# Patient Record
Sex: Male | Born: 1955 | Race: Black or African American | Hispanic: No | Marital: Single | Smoking: Never smoker
Health system: Southern US, Community
[De-identification: ages and names within clinical notes are randomized; demographics above are authoritative.]

## PROBLEM LIST (undated history)

## (undated) DIAGNOSIS — I219 Acute myocardial infarction, unspecified: Secondary | ICD-10-CM

## (undated) DIAGNOSIS — E785 Hyperlipidemia, unspecified: Secondary | ICD-10-CM

## (undated) DIAGNOSIS — M1990 Unspecified osteoarthritis, unspecified site: Secondary | ICD-10-CM

## (undated) DIAGNOSIS — I509 Heart failure, unspecified: Secondary | ICD-10-CM

## (undated) DIAGNOSIS — I10 Essential (primary) hypertension: Secondary | ICD-10-CM

## (undated) HISTORY — PX: HAND SURGERY: SHX662

## (undated) HISTORY — PX: NECK SURGERY: SHX720

---

## 1999-10-24 ENCOUNTER — Emergency Department (HOSPITAL_COMMUNITY): Admission: EM | Admit: 1999-10-24 | Discharge: 1999-10-24 | Payer: Self-pay | Admitting: Emergency Medicine

## 2002-05-29 ENCOUNTER — Emergency Department (HOSPITAL_COMMUNITY): Admission: EM | Admit: 2002-05-29 | Discharge: 2002-05-29 | Payer: Self-pay | Admitting: Emergency Medicine

## 2002-07-03 ENCOUNTER — Ambulatory Visit (HOSPITAL_BASED_OUTPATIENT_CLINIC_OR_DEPARTMENT_OTHER): Admission: RE | Admit: 2002-07-03 | Discharge: 2002-07-03 | Payer: Self-pay | Admitting: Surgery

## 2003-06-29 ENCOUNTER — Encounter: Payer: Self-pay | Admitting: Emergency Medicine

## 2003-06-29 ENCOUNTER — Inpatient Hospital Stay (HOSPITAL_COMMUNITY): Admission: EM | Admit: 2003-06-29 | Discharge: 2003-07-02 | Payer: Self-pay | Admitting: Emergency Medicine

## 2004-12-10 ENCOUNTER — Emergency Department (HOSPITAL_COMMUNITY): Admission: EM | Admit: 2004-12-10 | Discharge: 2004-12-10 | Payer: Self-pay | Admitting: Emergency Medicine

## 2007-03-21 ENCOUNTER — Ambulatory Visit: Payer: Self-pay | Admitting: Gastroenterology

## 2007-04-27 ENCOUNTER — Ambulatory Visit: Payer: Self-pay | Admitting: Gastroenterology

## 2007-04-27 ENCOUNTER — Encounter: Payer: Self-pay | Admitting: Gastroenterology

## 2011-03-19 NOTE — Op Note (Signed)
NAME:  RAYWOOD, WAILES                        ACCOUNT NO.:  [id number]   MEDICAL RECORD NO.:  [id number]                   PATIENT TYPE:  OBV   LOCATION:  5030                                 FACILITY:  [facility]   PHYSICIAN:  Artist Pais. Mina Marble, M.D.           DATE OF BIRTH:  May 31, 1956   DATE OF PROCEDURE:  06/29/2003  DATE OF DISCHARGE:                                 OPERATIVE REPORT   PREOPERATIVE DIAGNOSIS:  Right hand, mid-palmar space infection with right  ring flexor sheath infection.   POSTOPERATIVE DIAGNOSIS:  Right hand, mid-palmar space infection with right  ring flexor sheath infection.   PROCEDURE:  Evacuation of mid-palmar space, right hand as well as flexor  sheath irrigation.   SURGEON:  Artist Pais. Mina Marble, M.D.   ASSISTANT:  None.   ANESTHESIA:  General.   TOURNIQUET TIME:  35 minutes.   COMPLICATIONS:  None.   DRAINS:  Catheter.   DESCRIPTION OF PROCEDURE:  The patient was taken to the operating room.  After the smooth induction of adequate general anesthesia, the right upper  extremity was prepped and draped in the usual sterile fashion. The arm was  elevated for 5 minutes, the tourniquet was then inflated to 250 mmHg.  At  this point in time, a Lorrene Reid type incision was made over the palmar aspect  of the right hand over the mid-palmar area, extending out to the ring  finger. This incision was taken down through skin and subcutaneous tissues.  Flaps were raised accordingly. Purulent material was encountered over the  flexor sheath in the mid-palmar space of the right hand. This was cultured  x2.  This was thoroughly irrigated with 1 L of normal saline, as well as  with antibiotic impregnanted solution.   A #5 pediatric feeding tube was then placed into the flexor sheath of the  ring finger and a second incision was made in the mid-lateral position of  the radial side of the distal interphalangeal joint, ring finger and the  sheath was entered  between the A5 and A4 pulleys.  Continuous irrigation was  then taken out through the flexor sheath using #5 pediatric feeding tube,  again with antibiotic impregnanted solution, as well as with normal saline.  The skin incisions were closed loosely with 4-0 nylon, with catheter left in  place for continuous irrigation.  The patient was then placed in a sterile  dressing with Xeroform, 4 x 4's and a compressive hand dressing.  The  patient tolerated the procedure well, went to the recovery room for  continuous irrigation of the right ring flexor sheath.                                                 Artist Pais Mina Marble, M.D.   MAW/MEDQ  D:  06/29/2003  T:  06/30/2003  Job:  [id number]

## 2011-03-19 NOTE — Discharge Summary (Signed)
   NAME:  Todd Lynn, Todd Lynn                        ACCOUNT NO.:  [id number]   MEDICAL RECORD NO.:  [id number]                   PATIENT TYPE:  INP   LOCATION:  5030                                 FACILITY:  [facility]   PHYSICIAN:  Artist Pais. Mina Marble, M.D.           DATE OF BIRTH:  03-02-1956   DATE OF ADMISSION:  06/29/2003  DATE OF DISCHARGE:  07/02/2003                                 DISCHARGE SUMMARY   PRINCIPAL DIAGNOSIS:  Right mid palmar space infection and right ring finger  infection.   PRINCIPAL PROCEDURE:  Evacuation mid palmar space infection, irrigation and  debridement flexor sheath right ring finger.   HISTORY:  Todd Lynn is a 55 year old right-hand-dominant male who presented  to the emergency department on June 29, 2003 with four to five day history  of pain and swelling in his right hand which had gotten worse in the past 48  hours prior to his presentation to the emergency room and presents with  fluctuance and pain and swelling of the ring finger of his dominant right  hand.  At the time of admission, he had no known allergies, he was taking no  medications.  No family or social history of significance.   Exam showed grossly infected hand with Kanavel sign positive.  He was  febrile.  He was taken to the operating room that day where he underwent  irrigation and debridement of the flexor sheath right ring finger as well as  mid palmar space infection.  He was admitted to the hand service where he  was put on IV Unasyn.  He continued on IV Unasyn for a period of 48 hours in  the hospital and was discharged after placement of PIC line for continuation  of his IV Unasyn to treat his flexor sheath infection which grew out  Methicillin resistant Staphylococcus aureus.  He was discharged from the  hospital on July 02, 2003 on IV antibiotics to follow up in my office in  24 hours.                                                 Artist Pais Mina Marble, M.D.    MAW/MEDQ  D:  08/07/2003  T:  08/07/2003  Job:  [id number]

## 2011-03-19 NOTE — Op Note (Signed)
   NAME:  Todd Lynn, Todd Lynn                        ACCOUNT NO.:  [id number]   MEDICAL RECORD NO.:  [id number]                   PATIENT TYPE:  AMB   LOCATION:  DSC                                  FACILITY:  [facility]   PHYSICIAN:  Tedford A. Magnus Ivan, M.D.           DATE OF BIRTH:  September 15, 1956   DATE OF PROCEDURE:  07/03/2002  DATE OF DISCHARGE:                                 OPERATIVE REPORT   PREOPERATIVE DIAGNOSIS:  Chronic anal fissure.   POSTOPERATIVE DIAGNOSIS:  Chronic anal fissure.   PROCEDURE:  Examination under anesthesia and lateral internal anal  sphincterotomy.   SURGEON:  Kainalu A. Magnus Ivan, M.D.   ANESTHESIA:  General endotracheal anesthesia and 0.25% Marcaine with  epinephrine.   ESTIMATED BLOOD LOSS:  Minimal.   DESCRIPTION OF PROCEDURE:  The patient was brought to the operating room and  placed in the supine position and electively intubated.  He was then placed  in a prone position.  His perineum was then prepped and draped in the usual  sterile fashion.  The Hill-Ferguson retractor was inserted through the anal  canal.  Circumferential examination of the anal canal revealed a normal anus  without any large hemorrhoidal complexes.  The patient did appear to have a  posterior anal fissure, which was cauterized.  Next a lateral incision was  made on the right perineum and the sphincter muscles were identified and  then split with the electrocautery partially.  Hemostasis was then achieved  with the cautery.  The wound was then closed with several interrupted 0  chromic sutures.  The patient tolerated the procedure well.  The perianal  area was then anesthetized with 0.25% Marcaine with epinephrine.  Gauze was  then placed.  The patient was then placed back in the supine position and  then extubated in the operating room and taken in stable condition to the  recovery room.                                               Marcellous A. Magnus Ivan, M.D.    DAB/MEDQ  D:   07/03/2002  T:  07/04/2002  Job:  [id number]

## 2011-03-19 NOTE — Consult Note (Signed)
   NAME:  Todd Lynn, Todd Lynn                        ACCOUNT NO.:  [id number]   MEDICAL RECORD NO.:  [id number]                   PATIENT TYPE:  EMS   LOCATION:  MAJO                                 FACILITY:  [facility]   PHYSICIAN:  Artist Pais. Mina Marble, M.D.           DATE OF BIRTH:  13-May-1956   DATE OF CONSULTATION:  06/29/2003  DATE OF DISCHARGE:                                   CONSULTATION   REASON FOR CONSULTATION:  Mr. Mcmanamon is a 55 year old right handed dominant  male who presents today with a 4-6 day history of pain and swelling in his  right hand, which has gotten worse over the past 24 hours. He presents today  with fluctuance and pain with motion of the ring finger on his dominant  right hand.   ALLERGIES:  No known drug allergies.   CURRENT MEDICATIONS:  None.   PAST MEDICAL/SURGICAL HISTORY:  None.   FAMILY HISTORY:  Noncontributory.   SOCIAL HISTORY:  Noncontributory.   PHYSICAL EXAMINATION:  GENERAL: A well developed, well nourished male.  Pleasant, alert, and oriented x3.  EXTREMITIES: Examination of his right hand, he has pain and swelling in the  palmar aspect of his hand, in the area of the mid palmar space. He has an  area of fluctuance approximately 1 x 1 cm. He has no streaking.  No  adenopathy. By history, he has had a temperature of up to 100.5 over the  past 24 hours. He also has positive Kanavel signs on his right ring finger  with pain over the flexor sheath, pain with passive flexion, and extension  of the digit.   LABORATORY DATA:  X-rays in the emergency department show no evidence of  acute fracture dislocation or any evidence of significant osseous  abnormalities. Also of note, he has some slight swelling dorsally on the  hand, fairly nonspecific.   ASSESSMENT:  A 55 year old male with a problem mid palmar space infection on  the right hand as well as a probable flexor sheath infection of the ring  finger on the same hand.   RECOMMENDATIONS:   At this point in time, he is to go to the operating room  for irrigation and debridement and placement of a continuous irrigation  catheter to the ring finger. Admission to the hospital for IV antibiotics.                                               Artist Pais Mina Marble, M.D.    MAW/MEDQ  D:  06/29/2003  T:  06/30/2003  Job:  [id number]

## 2012-01-01 ENCOUNTER — Encounter (HOSPITAL_BASED_OUTPATIENT_CLINIC_OR_DEPARTMENT_OTHER): Payer: Self-pay | Admitting: *Deleted

## 2012-01-01 ENCOUNTER — Emergency Department (HOSPITAL_BASED_OUTPATIENT_CLINIC_OR_DEPARTMENT_OTHER)
Admission: EM | Admit: 2012-01-01 | Discharge: 2012-01-01 | Disposition: A | Discharge status: Home Health | Payer: Managed Care, Other (non HMO) | Attending: Emergency Medicine | Admitting: Emergency Medicine

## 2012-01-01 DIAGNOSIS — R197 Diarrhea, unspecified: Secondary | ICD-10-CM

## 2012-01-01 DIAGNOSIS — R109 Unspecified abdominal pain: Secondary | ICD-10-CM | POA: Insufficient documentation

## 2012-01-01 LAB — DIFFERENTIAL
Basophils Absolute: 0 10*3/uL (ref 0.0–0.1)
Eosinophils Relative: 2 % (ref 0–5)
Lymphocytes Relative: 7 % — ABNORMAL LOW (ref 12–46)
Neutro Abs: 10.9 10*3/uL — ABNORMAL HIGH (ref 1.7–7.7)

## 2012-01-01 LAB — LIPASE, BLOOD: Lipase: 17 U/L (ref 11–59)

## 2012-01-01 LAB — COMPREHENSIVE METABOLIC PANEL
BUN: 28 mg/dL — ABNORMAL HIGH (ref 6–23)
CO2: 23 mEq/L (ref 19–32)
Chloride: 103 mEq/L (ref 96–112)
Creatinine, Ser: 1.1 mg/dL (ref 0.50–1.35)
GFR calc non Af Amer: 74 mL/min — ABNORMAL LOW (ref >90)
Glucose, Bld: 116 mg/dL — ABNORMAL HIGH (ref 70–99)
Total Bilirubin: 0.5 mg/dL (ref 0.3–1.2)

## 2012-01-01 LAB — OCCULT BLOOD X 1 CARD TO LAB, STOOL: Fecal Occult Bld: NEGATIVE

## 2012-01-01 LAB — URINALYSIS, ROUTINE W REFLEX MICROSCOPIC
Glucose, UA: NEGATIVE mg/dL
Leukocytes, UA: NEGATIVE
Specific Gravity, Urine: 1.027 (ref 1.005–1.030)

## 2012-01-01 LAB — CBC
MCV: 80.1 fL (ref 78.0–100.0)
Platelets: 236 10*3/uL (ref 150–400)
RDW: 14.6 % (ref 11.5–15.5)
WBC: 12.7 10*3/uL — ABNORMAL HIGH (ref 4.0–10.5)

## 2012-01-01 LAB — URINE MICROSCOPIC-ADD ON

## 2012-01-01 MED ORDER — SODIUM CHLORIDE 0.9 % IV BOLUS (SEPSIS)
1000.0000 mL | Freq: Once | INTRAVENOUS | Status: AC
  Administered 2012-01-01: 1000 mL via INTRAVENOUS

## 2012-01-01 MED ORDER — DICYCLOMINE HCL 20 MG PO TABS: 20.0000 mg | ORAL_TABLET | Freq: Two times a day (BID) | ORAL | Status: DC

## 2012-01-01 MED ORDER — DICYCLOMINE HCL 10 MG/ML IM SOLN
20.0000 mg | Freq: Once | INTRAMUSCULAR | Status: AC
  Administered 2012-01-01: 20 mg via INTRAMUSCULAR
  Filled 2012-01-01: qty 2

## 2012-01-01 NOTE — ED Provider Notes (Signed)
History     CSN: [id number]  Arrival date & time 01/01/12  2208   First MD Initiated Contact with Patient 01/01/12 2224      Chief Complaint  Patient presents with  . Diarrhea  . Abdominal Cramping    (Consider location/radiation/quality/duration/timing/severity/associated sxs/prior treatment) HPI Comments: Pt states that he has had diarrhea since he woke up this morning and had diarrhea throughout the day:pt states that he developed mid abdominal cramping that is intermittent:pt denies vomiting or fever:pt states that he was trying to change his diet yesterday and ate a lot of chicken, shrimp and grapes  Patient is a 56 y.o. male presenting with diarrhea and cramps. The history is provided by the patient. No language interpreter was used.  Diarrhea The primary symptoms include diarrhea. Primary symptoms do not include fever, nausea or vomiting. The illness began today.  The illness does not include constipation.  Abdominal Cramping The primary symptoms of the illness include diarrhea. The primary symptoms of the illness do not include fever, nausea or vomiting. The current episode started 6 to 12 hours ago. The onset of the illness was gradual.  Symptoms associated with the illness do not include constipation or frequency.    History reviewed. No pertinent past medical history.  History reviewed. No pertinent past surgical history.  History reviewed. No pertinent family history.  History  Substance Use Topics  . Smoking status: Never Smoker   . Smokeless tobacco: Not on file  . Alcohol Use:       Review of Systems  Constitutional: Negative for fever.  Gastrointestinal: Positive for diarrhea. Negative for nausea, vomiting and constipation.  Genitourinary: Negative for frequency.  All other systems reviewed and are negative.    Allergies  Review of patient's allergies indicates no known allergies.  Home Medications  No current outpatient prescriptions on  file.  BP 140/92  Pulse 78  Temp(Src) 98.3 F (36.8 C) (Oral)  Resp 20  Ht 5\' 8"  (1.727 m)  Wt 215 lb (97.523 kg)  BMI 32.69 kg/m2  SpO2 100%  Physical Exam  Nursing note and vitals reviewed. Constitutional: He is oriented to person, place, and time. He appears well-developed and well-nourished.  HENT:  Head: Normocephalic and atraumatic.  Eyes: EOM are normal.  Neck: Neck supple.  Cardiovascular: Normal rate and regular rhythm.   Pulmonary/Chest: Effort normal and breath sounds normal.  Abdominal: Soft. Bowel sounds are normal. There is no tenderness.  Musculoskeletal: Normal range of motion.  Neurological: He is alert and oriented to person, place, and time.  Skin: Skin is warm and dry.  Psychiatric: He has a normal mood and affect.    ED Course  Procedures (including critical care time)  Labs Reviewed  URINALYSIS, ROUTINE W REFLEX MICROSCOPIC - Abnormal; Notable for the following:    Hgb urine dipstick SMALL (*)    All other components within normal limits  COMPREHENSIVE METABOLIC PANEL - Abnormal; Notable for the following:    Glucose, Bld 116 (*)    BUN 28 (*)    GFR calc non Af Amer 74 (*)    GFR calc Af Amer 86 (*)    All other components within normal limits  CBC - Abnormal; Notable for the following:    WBC 12.7 (*)    All other components within normal limits  DIFFERENTIAL - Abnormal; Notable for the following:    Neutrophils Relative 86 (*)    Neutro Abs 10.9 (*)    Lymphocytes Relative 7 (*)  All other components within normal limits  LIPASE, BLOOD  OCCULT BLOOD X 1 CARD TO LAB, STOOL  URINE MICROSCOPIC-ADD ON   No results found.   1. Diarrhea       MDM  Pt is feeling a lot better after the bentyl:will send home with something:pt symptoms likely viral:no recent antibiotics:pt abdomen is benign        Teressa Lower, NP 01/01/12 2351

## 2012-01-01 NOTE — ED Notes (Signed)
Pt stated that he has just urinated prior to check in, unable to provide specimen at this time.

## 2012-01-01 NOTE — ED Notes (Signed)
Pt presents to ED today with diarrhea and mid abd pain that started this afternoon and has steadily gotten worse.

## 2012-01-01 NOTE — Discharge Instructions (Signed)
Diarrhea Infections caused by germs (bacterial) or a virus commonly cause diarrhea. Your caregiver has determined that with time, rest and fluids, the diarrhea should improve. In general, eat normally while drinking more water than usual. Although water may prevent dehydration, it does not contain salt and minerals (electrolytes). Broths, weak tea without caffeine and oral rehydration solutions (ORS) replace fluids and electrolytes. Small amounts of fluids should be taken frequently. Large amounts at one time may not be tolerated. Plain water may be harmful in infants and the elderly. Oral rehydrating solutions (ORS) are available at pharmacies and grocery stores. ORS replace water and important electrolytes in proper proportions. Sports drinks are not as effective as ORS and may be harmful due to sugars worsening diarrhea.  ORS is especially recommended for use in children with diarrhea. As a general guideline for children, replace any new fluid losses from diarrhea and/or vomiting with ORS as follows:   If your child weighs 22 pounds or under (10 kg or less), give 60-120 mL ( -  cup or 2 - 4 ounces) of ORS for each episode of diarrheal stool or vomiting episode.   If your child weighs more than 22 pounds (more than 10 kgs), give 120-240 mL ( - 1 cup or 4 - 8 ounces) of ORS for each diarrheal stool or episode of vomiting.   While correcting for dehydration, children should eat normally. However, foods high in sugar should be avoided because this may worsen diarrhea. Large amounts of carbonated soft drinks, juice, gelatin desserts and other highly sugared drinks should be avoided.   After correction of dehydration, other liquids that are appealing to the child may be added. Children should drink small amounts of fluids frequently and fluids should be increased as tolerated. Children should drink enough fluids to keep urine clear or pale yellow.   Adults should eat normally while drinking more fluids  than usual. Drink small amounts of fluids frequently and increase as tolerated. Drink enough fluids to keep urine clear or pale yellow. Broths, weak decaffeinated tea, lemon lime soft drinks (allowed to go flat) and ORS replace fluids and electrolytes.   Avoid:   Carbonated drinks.   Juice.   Extremely hot or cold fluids.   Caffeine drinks.   Fatty, greasy foods.   Alcohol.   Tobacco.   Too much intake of anything at one time.   Gelatin desserts.   Probiotics are active cultures of beneficial bacteria. They may lessen the amount and number of diarrheal stools in adults. Probiotics can be found in yogurt with active cultures and in supplements.   Wash hands well to avoid spreading bacteria and virus.   Anti-diarrheal medications are not recommended for infants and children.   Only take over-the-counter or prescription medicines for pain, discomfort or fever as directed by your caregiver. Do not give aspirin to children because it may cause Reye's Syndrome.   For adults, ask your caregiver if you should continue all prescribed and over-the-counter medicines.   If your caregiver has given you a follow-up appointment, it is very important to keep that appointment. Not keeping the appointment could result in a chronic or permanent injury, and disability. If there is any problem keeping the appointment, you must call back to this facility for assistance.  SEEK IMMEDIATE MEDICAL CARE IF:   You or your child is unable to keep fluids down or other symptoms or problems become worse in spite of treatment.   Vomiting or diarrhea develops and becomes persistent.     There is vomiting of blood or bile (green material).   There is blood in the stool or the stools are black and tarry.   There is no urine output in 6-8 hours or there is only a small amount of very dark urine.   Abdominal pain develops, increases or localizes.   You have a fever.   Your baby is older than 3 months with a  rectal temperature of 102 F (38.9 C) or higher.   Your baby is 68 months old or younger with a rectal temperature of 100.4 F (38 C) or higher.   You or your child develops excessive weakness, dizziness, fainting or extreme thirst.   You or your child develops a rash, stiff neck, severe headache or become irritable or sleepy and difficult to awaken.  MAKE SURE YOU:   Understand these instructions.   Will watch your condition.   Will get help right away if you are not doing well or get worse.  Document Released: 10/08/2002 Document Revised: 06/30/2011 Document Reviewed: 08/25/2009 [facility] Patient Information 2012 [place], [place].B.R.A.T. Diet Your doctor has recommended the B.R.A.T. diet for you or your child until the condition improves. This is often used to help control diarrhea and vomiting symptoms. If you or your child can tolerate clear liquids, you may have:  Bananas.   Rice.   Applesauce.   Toast (and other simple starches such as crackers, potatoes, noodles).  Be sure to avoid dairy products, meats, and fatty foods until symptoms are better. Fruit juices such as apple, grape, and prune juice can make diarrhea worse. Avoid these. Continue this diet for 2 days or as instructed by your caregiver. Document Released: 10/18/2005 Document Revised: 06/30/2011 Document Reviewed: 04/06/2007 [facility] Patient Information 2012 [place], [place].

## 2012-01-06 NOTE — ED Provider Notes (Signed)
Medical screening examination/treatment/procedure(s) were performed by non-physician practitioner and as supervising physician I was immediately available for consultation/collaboration.   Dayton Bailiff, MD 01/06/12 2049

## 2014-05-14 ENCOUNTER — Ambulatory Visit (INDEPENDENT_AMBULATORY_CARE_PROVIDER_SITE_OTHER): Payer: Managed Care, Other (non HMO)

## 2014-05-14 ENCOUNTER — Ambulatory Visit (INDEPENDENT_AMBULATORY_CARE_PROVIDER_SITE_OTHER): Payer: Managed Care, Other (non HMO) | Admitting: Family Medicine

## 2014-05-14 VITALS — BP 140/90 | HR 63 | Temp 98.3°F | Resp 16 | Ht 68.25 in | Wt 213.2 lb

## 2014-05-14 DIAGNOSIS — R0789 Other chest pain: Secondary | ICD-10-CM

## 2014-05-14 DIAGNOSIS — Z13220 Encounter for screening for lipoid disorders: Secondary | ICD-10-CM

## 2014-05-14 DIAGNOSIS — E663 Overweight: Secondary | ICD-10-CM | POA: Insufficient documentation

## 2014-05-14 LAB — CBC
HEMATOCRIT: 45.3 % (ref 39.0–52.0)
Hemoglobin: 15.6 g/dL (ref 13.0–17.0)
MCH: 28.1 pg (ref 26.0–34.0)
MCHC: 34.4 g/dL (ref 30.0–36.0)
MCV: 81.6 fL (ref 78.0–100.0)
PLATELETS: 251 10*3/uL (ref 150–400)
RBC: 5.55 MIL/uL (ref 4.22–5.81)
RDW: 14.7 % (ref 11.5–15.5)
WBC: 10.2 10*3/uL (ref 4.0–10.5)

## 2014-05-14 NOTE — Patient Instructions (Signed)
Start taking 81 mg of aspirin daily to help protect your heart.  If your symptoms return or are persistent please seek help.  If you have chest pain that lasts for several minutes or is severe please go to the nearest ER or call 911  I will be in touch with your labs Your blood pressure is borderline- keep an eye on this

## 2014-05-14 NOTE — Progress Notes (Signed)
[facility] [address], [place] [id number] [phone]  Date:  05/14/2014   Name:  Todd Lynn   DOB:  10-Nov-1955   MRN:  [id number]  PCP:  No primary provider on file.    Chief Complaint: Chest Pain   History of Present Illness:  Todd Lynn is a 58 y.o. very pleasant male patient who presents with the following:  He is here today with pain in his left chest- he noted it this morning while sitting in his car at a red light. Felt a sharp pain under the left breast, which impeded his taking a deep breath.  It happened just one time and lasted less than 10 seconds.  No associated sweating, nausea, arm or jaw pain.  He felt like this was probably a muscle cramp but wanted to be sure all is well Right now he feels fine. No CP or SOB currently He felt fine yesterday.    He does not have any known heart problems.  He has not done a stress test or had other cardiac eval that he can recall He is not aware of any family history of heart problems.  He does walk some for for exercise and can walk and climb stairs iwthout any CP or SOB.    He does chew tobacco but does not smoke.  He does not use any drugs.   There are no active problems to display for this patient.   History reviewed. No pertinent past medical history.  History reviewed. No pertinent past surgical history.  History  Substance Use Topics  . Smoking status: Never Smoker   . Smokeless tobacco: Not on file  . Alcohol Use:     History reviewed. No pertinent family history.  No Known Allergies  Medication list has been reviewed and updated.  Current Outpatient Prescriptions on File Prior to Visit  Medication Sig Dispense Refill  . dicyclomine (BENTYL) 20 MG tablet Take 1 tablet (20 mg total) by mouth 2 (two) times daily.  20 tablet  0   No current facility-administered medications on file prior to visit.    Review of Systems:  As per HPI- otherwise negative.   Physical  Examination: Filed Vitals:   05/14/14 1355  BP: 138/92  Pulse: 63  Temp: 98.3 F (36.8 C)  Resp: 16   Filed Vitals:   05/14/14 1355  Height: 5' 8.25" (1.734 m)  Weight: 213 lb 3.2 oz (96.707 kg)   Body mass index is 32.16 kg/(m^2). Ideal Body Weight: Weight in (lb) to have BMI = 25: 165.3  GEN: WDWN, NAD, Non-toxic, A & O x 3, muscular build and mild overweight, otherwise looks well HEENT: Atraumatic, Normocephalic. Neck supple. No masses, No LAD.  Bilateral TM wnl, oropharynx normal.  PEERL,EOMI.   Ears and Nose: No external deformity. CV: RRR, No M/G/R. No JVD. No thrill. No extra heart sounds. PULM: CTA B, no wheezes, crackles, rhonchi. No retractions. No resp. distress. No accessory muscle use. I am able to evoke a vague tenderness over the left ribs just under the left chest.  No rash or lesion ABD: S, NT, ND, +BS. No rebound. No HSM. EXTR: No c/c/e NEURO Normal gait.  PSYCH: Normally interactive. Conversant. Not depressed or anxious appearing.  Calm demeanor.   EKG: sinus bradycardia  [facility] reading (PRIMARY) by  Dr. Patsy Lageropland. CXR: negative CHEST 2 VIEW  COMPARISON: None.  FINDINGS: Midline trachea. Normal heart size and mediastinal contours.  Lambert ModySharp  costophrenic angles. No pneumothorax. Clear lungs.  IMPRESSION: Normal chest.  He last ate at breakfast about 8 hours ago  Assessment and Plan: Other chest pain - Plan: EKG 12-Lead, DG Chest 2 View, CBC, Comprehensive metabolic panel  Screening for hyperlipidemia - Plan: Lipid panel  Daylon had a very brief episode of CP this morning.  His sx are now resolved, and his exam and EKG are reassuring.  Offered further eval of his CP now, but he would prefer to see if it comes back which is reasonable.  Will check labs as above for risk stratification, and recommended a baby asa daily.   See patient instructions for more details.     Signed Abbe Amsterdam, MD

## 2014-05-14 NOTE — Progress Notes (Signed)
58 year old male is here with complaints of left breast chest pain x1 day.  Pt indicates pain is sharp and has exp little tightness.  He states pain felt like a muscle spasm.  Pt states no other sxs

## 2014-05-15 ENCOUNTER — Encounter: Payer: Self-pay | Admitting: Family Medicine

## 2014-05-15 LAB — LIPID PANEL
CHOLESTEROL: 190 mg/dL (ref 0–200)
HDL: 57 mg/dL (ref >39)
LDL Cholesterol: 116 mg/dL — ABNORMAL HIGH (ref 0–99)
Total CHOL/HDL Ratio: 3.3 Ratio
Triglycerides: 84 mg/dL (ref <150)
VLDL: 17 mg/dL (ref 0–40)

## 2014-05-15 LAB — COMPREHENSIVE METABOLIC PANEL
ALBUMIN: 4.4 g/dL (ref 3.5–5.2)
ALT: 19 U/L (ref 0–53)
AST: 14 U/L (ref 0–37)
Alkaline Phosphatase: 86 U/L (ref 39–117)
BUN: 22 mg/dL (ref 6–23)
CALCIUM: 9.5 mg/dL (ref 8.4–10.5)
CHLORIDE: 104 meq/L (ref 96–112)
CO2: 27 meq/L (ref 19–32)
Creat: 1 mg/dL (ref 0.50–1.35)
Glucose, Bld: 94 mg/dL (ref 70–99)
POTASSIUM: 5.1 meq/L (ref 3.5–5.3)
Sodium: 138 mEq/L (ref 135–145)
TOTAL PROTEIN: 7.4 g/dL (ref 6.0–8.3)
Total Bilirubin: 0.5 mg/dL (ref 0.2–1.2)

## 2015-05-01 ENCOUNTER — Ambulatory Visit (INDEPENDENT_AMBULATORY_CARE_PROVIDER_SITE_OTHER): Payer: Managed Care, Other (non HMO) | Admitting: Physician Assistant

## 2015-05-01 VITALS — BP 120/74 | HR 77 | Temp 99.4°F | Resp 18 | Ht 69.0 in | Wt 209.0 lb

## 2015-05-01 DIAGNOSIS — J029 Acute pharyngitis, unspecified: Secondary | ICD-10-CM

## 2015-05-01 DIAGNOSIS — J020 Streptococcal pharyngitis: Secondary | ICD-10-CM

## 2015-05-01 LAB — POCT RAPID STREP A (OFFICE): Rapid Strep A Screen: POSITIVE — AB

## 2015-05-01 MED ORDER — FIRST-MOUTHWASH BLM MT SUSP: OROMUCOSAL | Status: DC

## 2015-05-01 MED ORDER — PENICILLIN G BENZATHINE 1200000 UNIT/2ML IM SUSP
1.2000 10*6.[IU] | Freq: Once | INTRAMUSCULAR | Status: AC
  Administered 2015-05-01: 1.2 10*6.[IU] via INTRAMUSCULAR

## 2015-05-01 NOTE — Patient Instructions (Signed)
Please push fluids.  Tylenol and Motrin for fever and body aches.    

## 2015-05-01 NOTE — Progress Notes (Signed)
   Todd Lynn  MRN: [id number] DOB: June 07, 1956  Subjective:  Pt presents to clinic with sore throat for the last 2 days that seems to be getting worse.  He is having trouble swallowing mainly from the pain because she feels like his throat is swollen.  He is having no other cold symptoms.  He has tried no home treatment for his symptoms.  He works nite shift and he was unable to go to work last night due to his pain.  Patient Active Problem List   Diagnosis Date Noted  . Overweight 05/14/2014    No current outpatient prescriptions on file prior to visit.   No current facility-administered medications on file prior to visit.    No Known Allergies  Review of Systems  Constitutional: Positive for fever (low grade - 99.9 last nght) and chills.  HENT: Positive for sore throat. Negative for congestion and postnasal drip.   Respiratory: Negative for cough.   Gastrointestinal: Negative.    Objective:  BP 120/74 mmHg  Pulse 77  Temp(Src) 99.4 F (37.4 C) (Oral)  Resp 18  Ht 5\' 9"  (1.753 m)  Wt 209 lb (94.802 kg)  BMI 30.85 kg/m2  SpO2 98%  Physical Exam  Constitutional: He is oriented to person, place, and time and well-developed, well-nourished, and in no distress.     HENT:  Head: Normocephalic and atraumatic.  Right Ear: Hearing, tympanic membrane, external ear and ear canal normal.  Left Ear: Hearing, tympanic membrane, external ear and ear canal normal.  Nose: Nose normal.  Mouth/Throat: Uvula is midline and mucous membranes are normal. Oropharyngeal exudate, posterior oropharyngeal edema and posterior oropharyngeal erythema present.  Eyes: Conjunctivae are normal.  Neck: Normal range of motion.  Cardiovascular: Normal rate, regular rhythm and normal heart sounds.   Pulmonary/Chest: Effort normal and breath sounds normal. He has no wheezes.  Lymphadenopathy:       Head (right side): No tonsillar and no occipital adenopathy present.       Head (left side): No  tonsillar and no occipital adenopathy present.    He has cervical adenopathy.       Right cervical: Superficial cervical adenopathy present.       Left cervical: Superficial cervical adenopathy present.       Right: No supraclavicular adenopathy present.       Left: No supraclavicular adenopathy present.  Neurological: He is alert and oriented to person, place, and time. Gait normal.  Skin: Skin is warm and dry.  Psychiatric: Mood, memory, affect and judgment normal.   Results for orders placed or performed in visit on 05/01/15  POCT rapid strep A  Result Value Ref Range   Rapid Strep A Screen Positive (A) Negative    Assessment and Plan :  Sore throat - Plan: POCT rapid strep A  Streptococcal sore throat - Plan: penicillin g benzathine (BICILLIN LA) 1200000 UNIT/2ML injection 1.2 Million Units, DPH-Lido-AlHydr-MgHydr-Simeth (FIRST-MOUTHWASH BLM) SUSP  Symptomatic treatment d/w pt.  Out of work for the next 24h due to the contagious nature of his illness.  Benny LennertSarah Jentzen Minasyan PA-C  [facility] 05/01/2015 9:16 AM

## 2015-05-03 NOTE — Addendum Note (Signed)
Addended by: Carmelina DaneANDERSON, JEFFERY S on: 05/03/2015 03:19 PM   Modules accepted: Kipp BroodSmartSet

## 2015-07-07 IMAGING — CR DG CHEST 2V
2 series · 2 of 2 positions shown · non-contrast
Comparison: None.

CLINICAL DATA: Chest pain.  Cough and fever.

EXAM:
CHEST  2 VIEW

[PA]
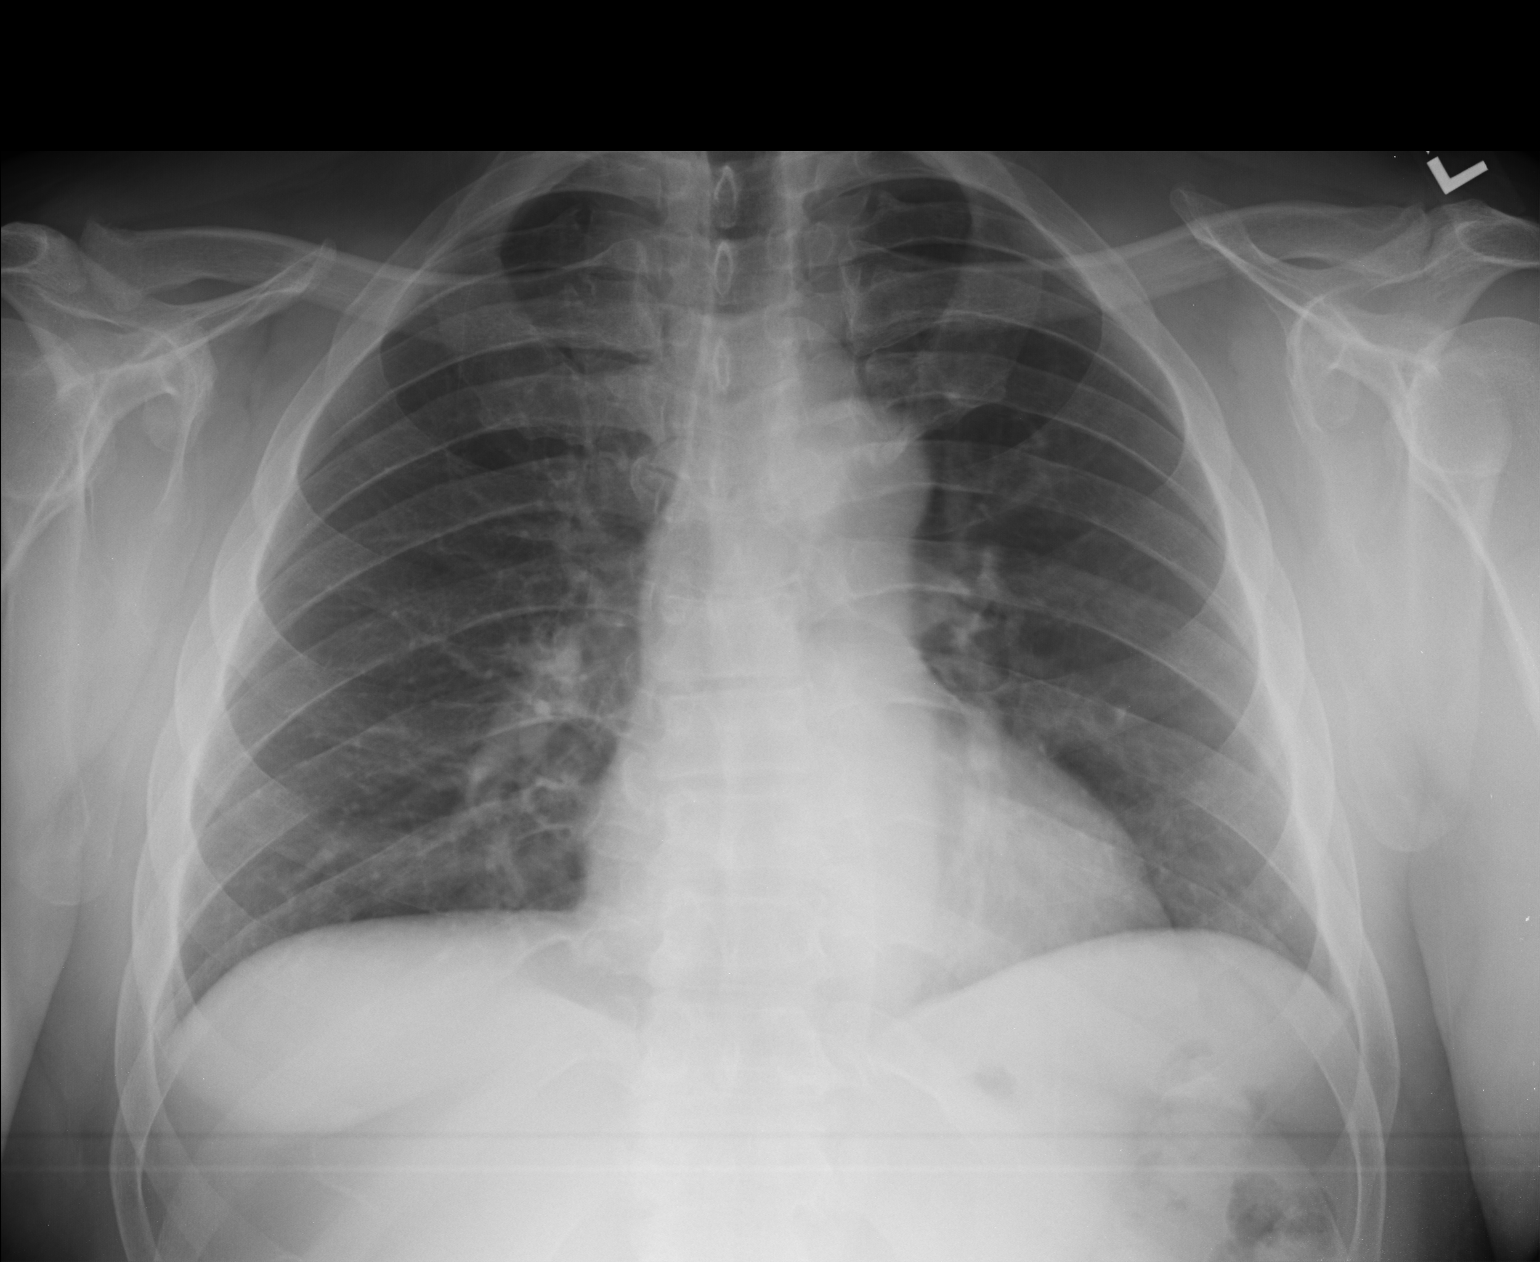

[lateral]
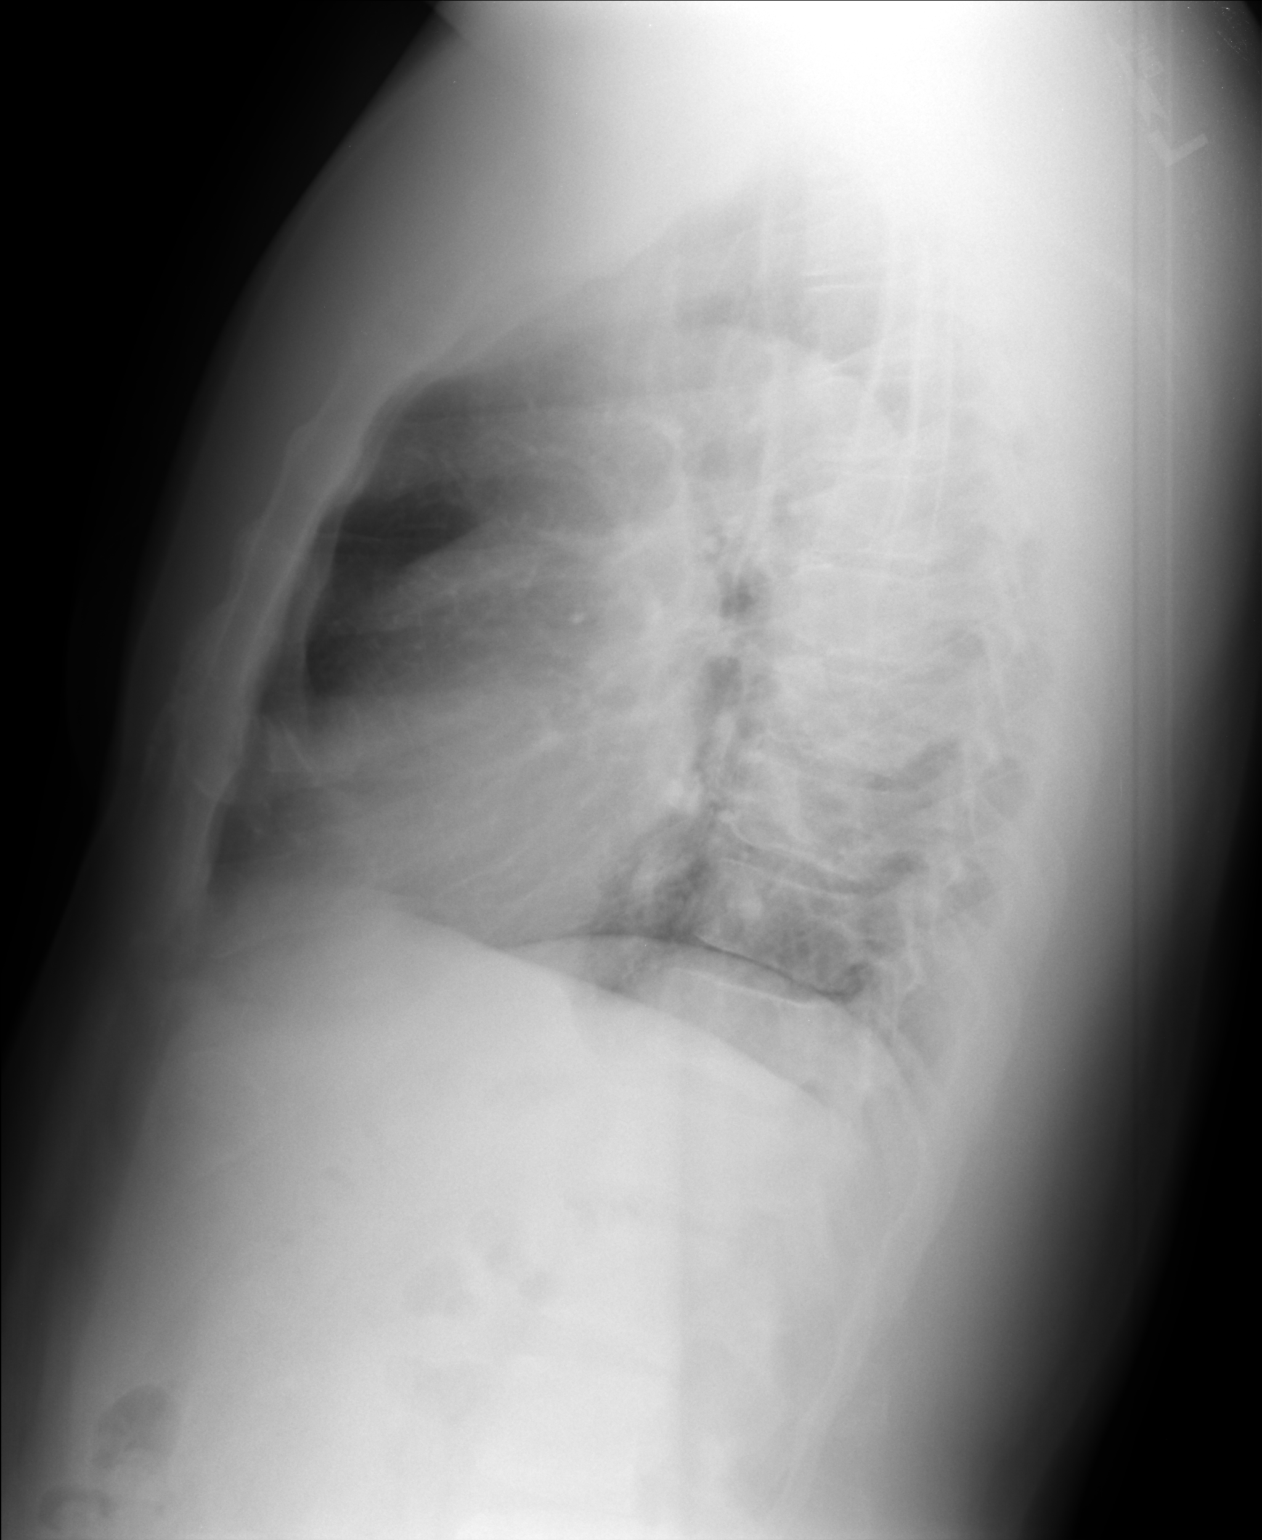

[2 of 2 positions shown; findings below may reference images not displayed]

FINDINGS: Midline trachea.  Normal heart size and mediastinal contours.

Sharp costophrenic angles.  No pneumothorax.  Clear lungs.
IMPRESSION: Normal chest.

## 2017-02-15 ENCOUNTER — Encounter: Payer: Self-pay | Admitting: Gastroenterology

## 2017-10-27 ENCOUNTER — Other Ambulatory Visit: Payer: Self-pay

## 2017-10-27 ENCOUNTER — Ambulatory Visit (HOSPITAL_COMMUNITY): Payer: 59

## 2017-10-27 ENCOUNTER — Ambulatory Visit (HOSPITAL_COMMUNITY)
Admission: EM | Admit: 2017-10-27 | Discharge: 2017-10-27 | Disposition: A | Discharge status: Home / Self Care | Payer: 59 | Attending: Emergency Medicine | Admitting: Emergency Medicine

## 2017-10-27 ENCOUNTER — Encounter (HOSPITAL_COMMUNITY): Payer: Self-pay | Admitting: Emergency Medicine

## 2017-10-27 DIAGNOSIS — R51 Headache: Secondary | ICD-10-CM | POA: Diagnosis not present

## 2017-10-27 DIAGNOSIS — S46919A Strain of unspecified muscle, fascia and tendon at shoulder and upper arm level, unspecified arm, initial encounter: Secondary | ICD-10-CM

## 2017-10-27 DIAGNOSIS — S53409A Unspecified sprain of unspecified elbow, initial encounter: Principal | ICD-10-CM

## 2017-10-27 DIAGNOSIS — S53401A Unspecified sprain of right elbow, initial encounter: Secondary | ICD-10-CM

## 2017-10-27 NOTE — ED Triage Notes (Signed)
Per pt right elbow pain, started yesterday after workout,

## 2017-10-27 NOTE — Discharge Instructions (Signed)
No workout with right upper extremity until cleared by orthopedist or physical therapist. Wear the arm sling off and on for the next 3-4 days. Remove periodically to move the shoulder around and for slow range of motion of the elbow. Follow-up with occupational health. Ice to the area of injury and swelling.

## 2017-10-27 NOTE — ED Provider Notes (Signed)
[facility]    CSN: [id number] Arrival date & time: 10/27/17  [id number]     History   Chief Complaint Chief Complaint  Patient presents with  . Elbow Pain    HPI Todd Lynn is a 61 y.o. male.   61 year old male who lifts weights and works out regularly states that he was bench pressing yesterday and noticed that about 30 minutes after his workout he developed pain to the right elbow. His primary workout involved a bench pressing. He has surrounding swelling and minor erythema. The swelling has much improved. But he does have continued limited range of motion. Denies any known injury as event. Denies trauma. Denies blunt trauma, popping feeling or sound. 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 This is the worst episode he has had before.      History reviewed. No pertinent past medical history.  Patient Active Problem List   Diagnosis Date Noted  . Overweight 05/14/2014    Past Surgical History:  Procedure Laterality Date  . HAND SURGERY Right   . NECK SURGERY         Home Medications    Prior to Admission medications   Medication Sig Start Date End Date Taking? Authorizing Provider  DPH-Lido-AlHydr-MgHydr-Simeth (FIRST-MOUTHWASH BLM) SUSP 5 ml every 1-2h prn sore throat - swish gargle spit. 05/01/15   Valarie ConesWeber, Dema SeverinSarah L, PA-C    Family History History reviewed. No pertinent family history.  Social History Social History   Tobacco Use  . Smoking status: Never Smoker  . Smokeless tobacco: Current User    Types: Chew  Substance Use Topics  . Alcohol use: Yes    Alcohol/week: 0.0 oz    Frequency: Never  . Drug use: No     Allergies   Patient has no known allergies.   Review of Systems Review of Systems  Constitutional: Negative.   Respiratory: Negative.   Gastrointestinal: Negative.   Genitourinary: Negative.   Musculoskeletal:       As per HPI  Skin: Negative.     Neurological: Positive for headaches. Negative for weakness and numbness.  All other systems reviewed and are negative.    Physical Exam Triage Vital Signs ED Triage Vitals [10/27/17 1022]  Enc Vitals Group     BP (!) 146/100     Pulse Rate 68     Resp      Temp 98 F (36.7 C)     Temp Source Oral     SpO2 99 %     Weight      Height      Head Circumference      Peak Flow      Pain Score 6     Pain Loc      Pain Edu?      Excl. in GC?    No data found.  Updated Vital Signs BP (!) 146/100 (BP Location: Left Arm)   Pulse 68   Temp 98 F (36.7 C) (Oral)   SpO2 99%   Visual Acuity Right Eye Distance:   Left Eye Distance:   Bilateral Distance:    Right Eye Near:   Left Eye Near:    Bilateral Near:     Physical Exam  Constitutional: He is oriented to person, place, and time. He appears well-developed and well-nourished.  HENT:  Head: Normocephalic and atraumatic.  Eyes: EOM are normal. Left eye  exhibits no discharge.  Neck: Normal range of motion. Neck supple.  Pulmonary/Chest: Effort normal.  Musculoskeletal: He exhibits tenderness. He exhibits no deformity.  Right elbow tenderness over the extensor tendons of the right forearm as it attaches to the olecranon. There is some swelling to this area. Pain is worse with extension of the wrist and digits. Patient does have some pain with limitation in flexion. No tenderness superior to the olecranon or involving the humerus. No biceps tenderness. Distal neurovascular motor Sentry is grossly intact. No bony tenderness.  Neurological: He is alert and oriented to person, place, and time. No cranial nerve deficit.  Skin: Skin is warm and dry.  Psychiatric: He has a normal mood and affect.  Nursing note and vitals reviewed.    UC Treatments / Results  Labs (all labs ordered are listed, but only abnormal results are displayed) Labs Reviewed - No data to display  EKG  EKG Interpretation None       Radiology No  results found.  Procedures Procedures (including critical care time)  Medications Ordered in UC Medications - No data to display   Initial Impression / Assessment and Plan / UC Course  I have reviewed the triage vital signs and the nursing notes.  Pertinent labs & imaging results that were available during my care of the patient were reviewed by me and considered in my medical decision making (see chart for details).    No workout with right upper extremity until cleared by orthopedist or physical therapist. Wear the arm sling off and on for the next 3-4 days. Remove periodically to move the shoulder around and for slow range of motion of the elbow. Follow-up with occupational health. Ice to the area of injury and swelling.    Final Clinical Impressions(s) / UC Diagnoses   Final diagnoses:  Sprain and strain of elbow    ED Discharge Orders    None       Controlled Substance Prescriptions [place] Controlled Substance Registry consulted? Not Applicable   Hayden RasmussenMabe, Larnce Schnackenberg, NP 10/27/17 1054

## 2017-10-27 NOTE — ED Notes (Signed)
Went to get patient for x-rays, provider was examining patient

## 2018-10-05 ENCOUNTER — Other Ambulatory Visit: Payer: Self-pay

## 2018-10-05 ENCOUNTER — Encounter (HOSPITAL_COMMUNITY): Payer: Self-pay

## 2018-10-05 ENCOUNTER — Ambulatory Visit (HOSPITAL_COMMUNITY)
Admission: EM | Admit: 2018-10-05 | Discharge: 2018-10-05 | Disposition: A | Discharge status: Home / Self Care | Payer: 59 | Attending: Nurse Practitioner | Admitting: Nurse Practitioner

## 2018-10-05 DIAGNOSIS — J206 Acute bronchitis due to rhinovirus: Secondary | ICD-10-CM

## 2018-10-05 MED ORDER — PREDNISONE 20 MG PO TABS: 40.0000 mg | ORAL_TABLET | Freq: Every day | ORAL | 0 refills | Status: AC

## 2018-10-05 MED ORDER — AZITHROMYCIN 250 MG PO TABS: 250.0000 mg | ORAL_TABLET | Freq: Every day | ORAL | 0 refills | Status: DC

## 2018-10-05 NOTE — Discharge Instructions (Addendum)
Take medications as prescribed.  You can continue taking the over-the-counter medications to control your symptoms as well.  Ibuprofen and/or Tylenol as needed for fevers or pain.  Drink plenty of fluids.  Follow-up with your primary care provider as needed.

## 2018-10-05 NOTE — ED Triage Notes (Signed)
Pt cc cough and cold. X 1 week or more.

## 2018-10-05 NOTE — ED Provider Notes (Signed)
[facility]    CSN: [id number] Arrival date & time: 10/05/18  1336     History   Chief Complaint Chief Complaint  Patient presents with  . Cough    HPI Todd Lynn is a 62 y.o. male.   Subjective:   Todd Lynn is a 62 y.o. male here for evaluation of a cough.  The cough is without wheezing, dyspnea or hemoptysis, productive of green/yellow sputum, waxing and waning over time and is aggravated by nothing. Onset of symptoms was a week and a half ago and has been waxing and waning since that time.  Associated symptoms include fever and rhinorrhea. Patient does not have a history of asthma. Patient has not had recent travel. Patient does not have a history of smoking.   The following portions of the patient's history were reviewed and updated as appropriate: allergies, current medications, past family history, past medical history, past social history, past surgical history and problem list.        History reviewed. No pertinent past medical history.  Patient Active Problem List   Diagnosis Date Noted  . Overweight 05/14/2014    Past Surgical History:  Procedure Laterality Date  . HAND SURGERY Right   . NECK SURGERY         Home Medications    Prior to Admission medications   Medication Sig Start Date End Date Taking? Authorizing Provider  azithromycin (ZITHROMAX) 250 MG tablet Take 1 tablet (250 mg total) by mouth daily. Take first 2 tablets together, then 1 every day until finished. 10/05/18   Lurline Idol, FNP  DPH-Lido-AlHydr-MgHydr-Simeth (FIRST-MOUTHWASH BLM) SUSP 5 ml every 1-2h prn sore throat - swish gargle spit. 05/01/15   Weber, Dema Severin, PA-C  predniSONE (DELTASONE) 20 MG tablet Take 2 tablets (40 mg total) by mouth daily for 5 days. 10/05/18 10/10/18  Lurline Idol, FNP    Family History History reviewed. No pertinent family history.  Social History Social History   Tobacco Use  . Smoking status: Never Smoker  .  Smokeless tobacco: Current User    Types: Chew  Substance Use Topics  . Alcohol use: Yes    Alcohol/week: 0.0 standard drinks    Frequency: Never  . Drug use: No     Allergies   Patient has no known allergies.   Review of Systems Review of Systems  Constitutional: Positive for fever.  HENT: Positive for congestion and rhinorrhea.   Eyes: Negative.   Respiratory: Positive for cough. Negative for shortness of breath and wheezing.   Cardiovascular: Negative.   Gastrointestinal: Negative.   Neurological: Negative.   All other systems reviewed and are negative.    Physical Exam Triage Vital Signs ED Triage Vitals  Enc Vitals Group     BP 10/05/18 1445 (!) 143/100     Pulse Rate 10/05/18 1445 72     Resp 10/05/18 1445 18     Temp 10/05/18 1445 98.4 F (36.9 C)     Temp Source 10/05/18 1445 Oral     SpO2 10/05/18 1445 100 %     Weight 10/05/18 1449 214 lb (97.1 kg)     Height --      Head Circumference --      Peak Flow --      Pain Score 10/05/18 1449 5     Pain Loc --      Pain Edu? --      Excl. in GC? --    No data found.  Updated Vital Signs BP (!) 143/100 (BP Location: Right Arm)   Pulse 72   Temp 98.4 F (36.9 C) (Oral)   Resp 18   Wt 214 lb (97.1 kg)   SpO2 100%   BMI 31.60 kg/m   Visual Acuity Right Eye Distance:   Left Eye Distance:   Bilateral Distance:    Right Eye Near:   Left Eye Near:    Bilateral Near:     Physical Exam  Constitutional: He is oriented to person, place, and time. He appears well-developed and well-nourished.  HENT:  Head: Normocephalic.  Right Ear: External ear normal.  Left Ear: External ear normal.  Nose: Nose normal.  Mouth/Throat: Oropharynx is clear and moist.  Eyes: Pupils are equal, round, and reactive to light. Conjunctivae and EOM are normal.  Neck: Normal range of motion. Neck supple.  Cardiovascular: Normal rate, regular rhythm and normal heart sounds.  Pulmonary/Chest: Effort normal and breath sounds  normal. No respiratory distress. He has no wheezes.  Musculoskeletal: Normal range of motion.  Lymphadenopathy:    He has no cervical adenopathy.  Neurological: He is alert and oriented to person, place, and time.  Skin: Skin is warm and dry.  Psychiatric: He has a normal mood and affect.     UC Treatments / Results  Labs (all labs ordered are listed, but only abnormal results are displayed) Labs Reviewed - No data to display  EKG None  Radiology No results found.  Procedures Procedures (including critical care time)  Medications Ordered in UC Medications - No data to display  Initial Impression / Assessment and Plan / UC Course  I have reviewed the triage vital signs and the nursing notes.  Pertinent labs & imaging results that were available during my care of the patient were reviewed by me and considered in my medical decision making (see chart for details).     62 year old male presenting with a week and a half history of persistent cough.  He has tried several different over-the-counter therapies without much relief in his symptoms.  Patient is afebrile.  Vital signs stable.  Nontoxic-appearing.  Physical exam unremarkable.  Conservative measures advised at this time.  Follow-up as needed.   Plan:  Antibiotics and steroids per medication orders. Continue over-the-counter therapies as needed for symptomatic management  Tylenol or ibuprofen as needed for fevers/pain  Follow-up with primary care provider as needed  Go to ED for shortness of breath, blood in sputum, change in character of cough, development of fever or chills, inability to maintain nutrition and hydration.  Avoid exposure to tobacco smoke and fumes.  Today's evaluation has revealed no signs of a dangerous process. Discussed diagnosis with patient. Patient aware of their diagnosis, possible red flag symptoms to watch out for and need for close follow up. Patient understands verbal and written discharge  instructions. Patient comfortable with plan and disposition.  Patient has a clear mental status at this time, good insight into illness (after discussion and teaching) and has clear judgment to make decisions regarding their care.  Documentation was completed with the aid of voice recognition software. Transcription may contain typographical errors. Final Clinical Impressions(s) / UC Diagnoses   Final diagnoses:  Acute bronchitis due to Rhinovirus     Discharge Instructions     Take medications as prescribed.  You can continue taking the over-the-counter medications to control your symptoms as well.  Ibuprofen and/or Tylenol as needed for fevers or pain.  Drink plenty of fluids.  Follow-up  with your primary care provider as needed.   ED Prescriptions    Medication Sig Dispense Auth. Provider   predniSONE (DELTASONE) 20 MG tablet Take 2 tablets (40 mg total) by mouth daily for 5 days. 10 tablet Lurline Idol, FNP   azithromycin (ZITHROMAX) 250 MG tablet Take 1 tablet (250 mg total) by mouth daily. Take first 2 tablets together, then 1 every day until finished. 6 tablet Lurline Idol, FNP     Controlled Substance Prescriptions [place] Controlled Substance Registry consulted? Not Applicable   Lurline Idol, [place] 10/05/18 1546

## 2020-03-19 ENCOUNTER — Other Ambulatory Visit: Payer: Self-pay

## 2020-03-19 ENCOUNTER — Ambulatory Visit (HOSPITAL_COMMUNITY)
Admission: EM | Admit: 2020-03-19 | Discharge: 2020-03-19 | Disposition: A | Discharge status: Home / Self Care | Payer: 59 | Attending: Emergency Medicine | Admitting: Emergency Medicine

## 2020-03-19 ENCOUNTER — Encounter (HOSPITAL_COMMUNITY): Payer: Self-pay

## 2020-03-19 DIAGNOSIS — Z20822 Contact with and (suspected) exposure to covid-19: Secondary | ICD-10-CM | POA: Insufficient documentation

## 2020-03-19 DIAGNOSIS — J020 Streptococcal pharyngitis: Secondary | ICD-10-CM | POA: Insufficient documentation

## 2020-03-19 LAB — POCT RAPID STREP A: Streptococcus, Group A Screen (Direct): POSITIVE — AB

## 2020-03-19 MED ORDER — AMOXICILLIN-POT CLAVULANATE 875-125 MG PO TABS: 1.0000 | ORAL_TABLET | Freq: Two times a day (BID) | ORAL | 0 refills | Status: AC

## 2020-03-19 MED ORDER — IBUPROFEN 800 MG PO TABS
ORAL_TABLET | ORAL | Status: AC
  Filled 2020-03-19: qty 1

## 2020-03-19 MED ORDER — ACETAMINOPHEN 325 MG PO TABS
975.0000 mg | ORAL_TABLET | Freq: Once | ORAL | Status: AC
  Administered 2020-03-19: 975 mg via ORAL

## 2020-03-19 MED ORDER — IBUPROFEN 600 MG PO TABS: 600.0000 mg | ORAL_TABLET | Freq: Four times a day (QID) | ORAL | 0 refills | Status: DC | PRN

## 2020-03-19 MED ORDER — DEXAMETHASONE SODIUM PHOSPHATE 10 MG/ML IJ SOLN
INTRAMUSCULAR | Status: AC
  Filled 2020-03-19: qty 1

## 2020-03-19 MED ORDER — DEXAMETHASONE SODIUM PHOSPHATE 10 MG/ML IJ SOLN
10.0000 mg | Freq: Once | INTRAMUSCULAR | Status: AC
  Administered 2020-03-19: 10 mg via INTRAMUSCULAR

## 2020-03-19 MED ORDER — ACETAMINOPHEN 325 MG PO TABS
ORAL_TABLET | ORAL | Status: AC
  Filled 2020-03-19: qty 3

## 2020-03-19 MED ORDER — IBUPROFEN 800 MG PO TABS
800.0000 mg | ORAL_TABLET | Freq: Once | ORAL | Status: AC
  Administered 2020-03-19: 800 mg via ORAL

## 2020-03-19 NOTE — ED Provider Notes (Signed)
HPI  SUBJECTIVE:  Patient reports sore throat starting 5 days ago. Sx worse with swallowing.  Sx better with nothing. Has been taking 200 mg of ibuprofen twice a day w/ o relief.  + Fever tmax 100   No neck stiffness  No Cough No nasal congestion, rhinorrhea No Myalgias No Headache No Rash  No loss of taste or smell No shortness of breath or difficulty breathing No nausea, vomiting No diarrhea No abdominal pain     No Recent Strep, mono, COVID exposure No reflux sxs No Allergy sxs  No Breathing difficulty, voice changes,  + sensation of throat swelling shut when he swallows No Drooling No Trismus No abx in past month.  Has gotten Pfizer Covid vaccine #2 in April No antipyretic in past 4-6 hrs  Past medical history of strep pharyngitis.  No history of diabetes, hypertension, mono, coronary disease, chronic kidney disease, HIV, immunocompromise, cancer.   PMD: [facility].   History reviewed. No pertinent past medical history.  Past Surgical History:  Procedure Laterality Date  . HAND SURGERY Right   . NECK SURGERY      History reviewed. No pertinent family history.  Social History   Tobacco Use  . Smoking status: Never Smoker  . Smokeless tobacco: Current User    Types: Chew  Substance Use Topics  . Alcohol use: Yes    Alcohol/week: 0.0 standard drinks  . Drug use: No    No current facility-administered medications for this encounter.  Current Outpatient Medications:  .  amoxicillin-clavulanate (AUGMENTIN) 875-125 MG tablet, Take 1 tablet by mouth 2 (two) times daily for 10 days., Disp: 20 tablet, Rfl: 0 .  ibuprofen (ADVIL) 600 MG tablet, Take 1 tablet (600 mg total) by mouth every 6 (six) hours as needed., Disp: 30 tablet, Rfl: 0  No Known Allergies   ROS  As noted in HPI.   Physical Exam  BP (!) 147/94 (BP Location: Right Arm)   Pulse 84   Temp (!) 101.3 F (38.5 C) (Oral)   Resp 16   SpO2 98%   Constitutional: Well developed,  well nourished, no acute distress Eyes:  EOMI, conjunctiva normal bilaterally HENT: Normocephalic, atraumatic,mucus membranes moist. - nasal congestion + intensely erythematous oropharynx + enlarged tonsils L>R + exudates. Uvula midline.  No trismus.  No muffled voice.  No drooling Respiratory: Normal inspiratory effort Cardiovascular: Normal rate, no murmurs, rubs, gallops GI: nondistended, nontender. No appreciable splenomegaly skin: No rash, skin intact Lymph: + Anterior cervical LN.  No posterior cervical lymphadenopathy.  No neck stiffness Musculoskeletal: no deformities Neurologic: Alert & oriented x 3, no focal neuro deficits Psychiatric: Speech and behavior appropriate.  ED Course   Medications  acetaminophen (TYLENOL) tablet 975 mg (975 mg Oral Given 03/19/20 1932)  ibuprofen (ADVIL) tablet 800 mg (800 mg Oral Given 03/19/20 1932)  dexamethasone (DECADRON) injection 10 mg (10 mg Intramuscular Given 03/19/20 1948)    Orders Placed This Encounter  Procedures  . SARS CORONAVIRUS 2 (TAT 6-24 HRS) Nasopharyngeal Nasopharyngeal Swab    Standing Status:   Standing    Number of Occurrences:   1    Order Specific Question:   Is this test for diagnosis or screening    Answer:   Screening    Order Specific Question:   Symptomatic for COVID-19 as defined by CDC    Answer:   No    Order Specific Question:   Hospitalized for COVID-19    Answer:   No  Order Specific Question:   Admitted to ICU for COVID-19    Answer:   No    Order Specific Question:   Previously tested for COVID-19    Answer:   No    Order Specific Question:   Resident in a congregate (group) care setting    Answer:   No    Order Specific Question:   Employed in healthcare setting    Answer:   No    Order Specific Question:   Has patient completed COVID vaccination(s) (2 doses of Pfizer/Moderna 1 dose of [employer])    Answer:   Yes  . POCT rapid strep A [facility])    Standing Status:   Standing     Number of Occurrences:   1    Results for orders placed or performed during the hospital encounter of 03/19/20 (from the past 24 hour(s))  POCT rapid strep A [facility])     Status: Abnormal   Collection Time: 03/19/20  7:41 PM  Result Value Ref Range   Streptococcus, Group A Screen (Direct) POSITIVE (A) NEGATIVE   No results found.  ED Clinical Impression  1. Strep pharyngitis      ED Assessment/Plan  Rapid strep positive.  Giving 800 mg IBU with 975 mg of Tylenol, 10 mg of dexamethasone IM x1 here. Home with Augmentin, ibuprofen, Tylenol, Benadryl/Maalox mixture. Patient to followup with PMD when necessary.  There is no evidence of peritonsillar abscess at this time, discussed with patient signs and symptoms of peritonsillar abscess.  He was given strict ER return precautions.  Discussed labs,  MDM, plan and followup with patient. Discussed sn/sx that should prompt return to the ED. patient agrees with plan.   Meds ordered this encounter  Medications  . acetaminophen (TYLENOL) tablet 975 mg  . ibuprofen (ADVIL) tablet 800 mg  . dexamethasone (DECADRON) injection 10 mg  . ibuprofen (ADVIL) 600 MG tablet    Sig: Take 1 tablet (600 mg total) by mouth every 6 (six) hours as needed.    Dispense:  30 tablet    Refill:  0  . amoxicillin-clavulanate (AUGMENTIN) 875-125 MG tablet    Sig: Take 1 tablet by mouth 2 (two) times daily for 10 days.    Dispense:  20 tablet    Refill:  0     *This clinic note was created using Scientist, clinical (histocompatibility and immunogenetics). Therefore, there may be occasional mistakes despite careful proofreading.     Domenick Gong, MD 03/19/20 2000

## 2020-03-19 NOTE — Discharge Instructions (Addendum)
Finish the Augmentin, even if you feel better.  1 gram of Tylenol and 600 mg ibuprofen together 3-4 times a day as needed for pain.  Make sure you drink plenty of extra fluids.  Some people find salt water gargles and  Traditional Medicinal's "Throat Coat" tea helpful. Take 5 mL of liquid Benadryl and 5 mL of Maalox. Mix it together, and then hold it in your mouth for as long as you can and then swallow. You may do this 4 times a day.  You should be feeling better in the next 24 hours.  Go to the ER if you get worse in any way.  Go to www.goodrx.com to look up your medications. This will give you a list of where you can find your prescriptions at the most affordable prices. Or ask the pharmacist what the cash price is, or if they have any other discount programs available to help make your medication more affordable. This can be less expensive than what you would pay with insurance.

## 2020-03-19 NOTE — ED Triage Notes (Signed)
C/o sore throat for a few days. Denies other symptoms.

## 2020-03-20 LAB — SARS CORONAVIRUS 2 (TAT 6-24 HRS): SARS Coronavirus 2: NEGATIVE

## 2020-08-25 ENCOUNTER — Other Ambulatory Visit: Payer: Self-pay

## 2020-08-25 ENCOUNTER — Encounter (HOSPITAL_COMMUNITY): Payer: Self-pay | Admitting: Family Medicine

## 2020-08-25 ENCOUNTER — Ambulatory Visit (HOSPITAL_COMMUNITY)
Admission: EM | Admit: 2020-08-25 | Discharge: 2020-08-25 | Disposition: A | Discharge status: Home / Self Care | Payer: Self-pay

## 2020-08-25 DIAGNOSIS — I10 Essential (primary) hypertension: Secondary | ICD-10-CM

## 2020-08-25 NOTE — Discharge Instructions (Addendum)
I would recommend seeing a primary care about your blood pressure.  Low sodium diet, exercise and loosing a few pounds can help.  Make sure that you are monitoring your blood pressures, and keep a log.  Follow up as needed for continued or worsening symptoms

## 2020-08-25 NOTE — ED Triage Notes (Signed)
Pt reports he went to his chiropractor today who refused to treat him because his BP was to high. Pt presents here for BP check . 158/98 in triage

## 2020-08-26 NOTE — ED Provider Notes (Signed)
[facility]    CSN: [id number] Arrival date & time: 08/25/20  1106      History   Chief Complaint Chief Complaint  Patient presents with  . Hypertension    HPI Todd Lynn is a 64 y.o. male.   Patient is a 64 year old male with no significant past medical history.  Todd Lynn presents today for elevated blood pressure reading.  Reporting Todd Lynn went to the chiropractor and they checked his blood pressure and recorded at 165/95.  They were unable to treat him due to his high blood pressure.  Denies being ever diagnosed with hypertension or being on medication.  Reading at 158/98 today.  Denies any concerning signs or symptoms to include dizziness, headache, blurred vision, chest pain or shortness of breath.     History reviewed. No pertinent past medical history.  Patient Active Problem List   Diagnosis Date Noted  . Overweight 05/14/2014    Past Surgical History:  Procedure Laterality Date  . HAND SURGERY Right   . NECK SURGERY         Home Medications    Prior to Admission medications   Medication Sig Start Date End Date Taking? Authorizing Provider  ibuprofen (ADVIL) 600 MG tablet Take 1 tablet (600 mg total) by mouth every 6 (six) hours as needed. 03/19/20  Yes Domenick Gong, MD    Family History History reviewed. No pertinent family history.  Social History Social History   Tobacco Use  . Smoking status: Never Smoker  . Smokeless tobacco: Current User    Types: Chew  Vaping Use  . Vaping Use: Never used  Substance Use Topics  . Alcohol use: Yes    Alcohol/week: 0.0 standard drinks  . Drug use: No     Allergies   Patient has no known allergies.   Review of Systems Review of Systems   Physical Exam Triage Vital Signs ED Triage Vitals  Enc Vitals Group     BP 08/25/20 1116 (!) 158/98     Pulse Rate 08/25/20 1116 71     Resp 08/25/20 1116 18     Temp --      Temp Source 08/25/20 1116 Oral     SpO2 08/25/20 1116 98 %      Weight 08/25/20 1117 215 lb (97.5 kg)     Height 08/25/20 1117 5\' 8"  (1.727 m)     Head Circumference --      Peak Flow --      Pain Score 08/25/20 1117 0     Pain Loc --      Pain Edu? --      Excl. in [place]? --    No data found.  Updated Vital Signs BP (!) 158/98 (BP Location: Right Arm)   Pulse 71   Resp 18   Ht 5\' 8"  (1.727 m)   Wt 215 lb (97.5 kg)   SpO2 98%   BMI 32.69 kg/m   Visual Acuity Right Eye Distance:   Left Eye Distance:   Bilateral Distance:    Right Eye Near:   Left Eye Near:    Bilateral Near:     Physical Exam Vitals and nursing note reviewed.  Constitutional:      Appearance: Normal appearance.  HENT:     Head: Normocephalic and atraumatic.     Nose: Nose normal.  Eyes:     Conjunctiva/sclera: Conjunctivae normal.  Cardiovascular:     Rate and Rhythm: Normal rate and regular rhythm.  Pulmonary:  Effort: Pulmonary effort is normal.     Breath sounds: Normal breath sounds.  Musculoskeletal:        General: Normal range of motion.     Cervical back: Normal range of motion.  Skin:    General: Skin is warm and dry.  Neurological:     General: No focal deficit present.     Mental Status: Todd Lynn is alert.  Psychiatric:        Mood and Affect: Mood normal.      UC Treatments / Results  Labs (all labs ordered are listed, but only abnormal results are displayed) Labs Reviewed - No data to display  EKG   Radiology No results found.  Procedures Procedures (including critical care time)  Medications Ordered in UC Medications - No data to display  Initial Impression / Assessment and Plan / UC Course  I have reviewed the triage vital signs and the nursing notes.  Pertinent labs & imaging results that were available during my care of the patient were reviewed by me and considered in my medical decision making (see chart for details).     Hypertension Patient had elevated blood pressure reading at the chiropractor today and elevated  blood pressure reading here today with value of 158/98. Rechecked by me with readings of 145/110 and 154/105 Patient plans to follow with PCP.  Todd Lynn is not having any concerning signs or symptoms. Recommended low-sodium diet, exercise and weight loss to improve blood pressure. Monitor blood pressures at home and keep a log Follow-up PCP in the next 4 to 6 weeks for recheck Final Clinical Impressions(s) / UC Diagnoses   Final diagnoses:  Hypertension, unspecified type     Discharge Instructions     I would recommend seeing a primary care about your blood pressure.  Low sodium diet, exercise and loosing a few pounds can help.  Make sure that you are monitoring your blood pressures, and keep a log.  Follow up as needed for continued or worsening symptoms     ED Prescriptions    None     PDMP not reviewed this encounter.   Janace Aris, NP 08/26/20 [phone]

## 2022-03-24 ENCOUNTER — Ambulatory Visit: Payer: Self-pay | Admitting: Family Medicine

## 2022-05-05 ENCOUNTER — Ambulatory Visit (INDEPENDENT_AMBULATORY_CARE_PROVIDER_SITE_OTHER): Payer: Medicare Other

## 2022-05-05 ENCOUNTER — Ambulatory Visit (HOSPITAL_COMMUNITY)
Admission: EM | Admit: 2022-05-05 | Discharge: 2022-05-05 | Disposition: A | Discharge status: Home / Self Care | Payer: Medicare Other | Attending: Family Medicine | Admitting: Family Medicine

## 2022-05-05 ENCOUNTER — Encounter (HOSPITAL_COMMUNITY): Payer: Self-pay | Admitting: Emergency Medicine

## 2022-05-05 DIAGNOSIS — M25552 Pain in left hip: Secondary | ICD-10-CM

## 2022-05-05 MED ORDER — KETOROLAC TROMETHAMINE 30 MG/ML IJ SOLN
INTRAMUSCULAR | Status: AC
  Filled 2022-05-05: qty 1

## 2022-05-05 MED ORDER — KETOROLAC TROMETHAMINE 30 MG/ML IJ SOLN
30.0000 mg | Freq: Once | INTRAMUSCULAR | Status: AC
  Administered 2022-05-05: 30 mg via INTRAMUSCULAR

## 2022-05-05 MED ORDER — METHYLPREDNISOLONE 4 MG PO TBPK: ORAL_TABLET | ORAL | 0 refills | Status: DC

## 2022-05-05 NOTE — ED Provider Notes (Signed)
[facility]    CSN: [id number] Arrival date & time: 05/05/22  6789      History   Chief Complaint Chief Complaint  Patient presents with   Hip Pain    HPI Todd Lynn is a 66 y.o. male.    Hip Pain   Here for left lateral hip pain.  This began yesterday and has worsened since it started.  No fall or trauma.  No rash, or fever, or urinary symptoms.  No neurologic symptoms  History reviewed. No pertinent past medical history.  Patient Active Problem List   Diagnosis Date Noted   Overweight 05/14/2014    Past Surgical History:  Procedure Laterality Date   HAND SURGERY Right    NECK SURGERY         Home Medications    Prior to Admission medications   Medication Sig Start Date End Date Taking? Authorizing Provider  methylPREDNISolone (MEDROL DOSEPAK) 4 MG TBPK tablet Take as per package instructions 05/05/22  Yes Shloimy Michalski, Janace Aris, MD    Family History History reviewed. No pertinent family history.  Social History Social History   Tobacco Use   Smoking status: Never   Smokeless tobacco: Current    Types: Chew  Vaping Use   Vaping Use: Never used  Substance Use Topics   Alcohol use: Yes    Alcohol/week: 0.0 standard drinks of alcohol   Drug use: No     Allergies   Patient has no known allergies.   Review of Systems Review of Systems   Physical Exam Triage Vital Signs ED Triage Vitals  Enc Vitals Group     BP 05/05/22 1026 (!) 154/99     Pulse Rate 05/05/22 1026 71     Resp 05/05/22 1026 14     Temp 05/05/22 1026 98 F (36.7 C)     Temp Source 05/05/22 1026 Oral     SpO2 05/05/22 1026 100 %     Weight --      Height --      Head Circumference --      Peak Flow --      Pain Score 05/05/22 1031 7     Pain Loc --      Pain Edu? --      Excl. in [place]? --    No data found.  Updated Vital Signs BP (!) 154/99 (BP Location: Left Arm)   Pulse 71   Temp 98 F (36.7 C) (Oral)   Resp 14   SpO2 100%   Visual  Acuity Right Eye Distance:   Left Eye Distance:   Bilateral Distance:    Right Eye Near:   Left Eye Near:    Bilateral Near:     Physical Exam Vitals reviewed.  Constitutional:      General: He is not in acute distress.    Appearance: He is not ill-appearing, toxic-appearing or diaphoretic.  Musculoskeletal:     Comments: There is some tenderness over his left greater trochanteric process and a little posterior to that.  No rashes seen.  No erythema or swelling there  Neurological:     Mental Status: He is alert and oriented to person, place, and time.  Psychiatric:        Behavior: Behavior normal.      UC Treatments / Results  Labs (all labs ordered are listed, but only abnormal results are displayed) Labs Reviewed - No data to display  EKG   Radiology DG Hip Unilat With  Pelvis 2-3 Views Left  Result Date: 05/05/2022 CLINICAL DATA:  Left hip pain.  No known injury EXAM: DG HIP (WITH OR WITHOUT PELVIS) 2-3V LEFT COMPARISON:  None Available. FINDINGS: There is no evidence of hip fracture or dislocation. There is no evidence of arthropathy or other focal bone abnormality. Spurring of the superior anteriorly iliac spine bilaterally left greater than right likely degenerative. IMPRESSION: Negative left hip Electronically Signed   By: Marlan Palau M.D.   On: 05/05/2022 11:08    Procedures Procedures (including critical care time)  Medications Ordered in UC Medications  ketorolac (TORADOL) 30 MG/ML injection 30 mg (has no administration in time range)    Initial Impression / Assessment and Plan / UC Course  I have reviewed the triage vital signs and the nursing notes.  Pertinent labs & imaging results that were available during my care of the patient were reviewed by me and considered in my medical decision making (see chart for details).     X-rays show a little bit of spurring over his anterior superior iliac spine.  No fractures or bone cyst.  I discussed with the  patient that this is most likely a bursitis or muscular strain.  We are going to treat with Toradol today and a steroid pack. Final Clinical Impressions(s) / UC Diagnoses   Final diagnoses:  Left hip pain     Discharge Instructions      You have been given a shot of Toradol 30 mg today.  Take the steroid pack according to the package instructions.  You can ice the area that hurts     ED Prescriptions     Medication Sig Dispense Auth. Provider   methylPREDNISolone (MEDROL DOSEPAK) 4 MG TBPK tablet Take as per package instructions 1 each Marlinda Mike, Janace Aris, MD      PDMP not reviewed this encounter.   Zenia Resides, MD 05/05/22 1122

## 2022-05-05 NOTE — Discharge Instructions (Addendum)
You have been given a shot of Toradol 30 mg today.  Take the steroid pack according to the package instructions.  You can ice the area that hurts

## 2022-05-05 NOTE — ED Triage Notes (Signed)
Patient c/o LFT hip pain that started yesterday.   Patient denies fall or trauma.   Patient endorses onset of symptoms began upon waking.   Patient endorses pain has progressively become worst.   Patient endorses worsening pain upon getting up from a chair or from laying position.   Patient hasn't taken any medications for symptoms.

## 2022-12-02 ENCOUNTER — Encounter (HOSPITAL_COMMUNITY): Payer: Self-pay | Admitting: *Deleted

## 2022-12-02 ENCOUNTER — Ambulatory Visit (HOSPITAL_COMMUNITY)
Admission: EM | Admit: 2022-12-02 | Discharge: 2022-12-02 | Disposition: A | Discharge status: Home / Self Care | Payer: Medicare Other | Attending: Emergency Medicine | Admitting: Emergency Medicine

## 2022-12-02 DIAGNOSIS — R0981 Nasal congestion: Secondary | ICD-10-CM

## 2022-12-02 DIAGNOSIS — J069 Acute upper respiratory infection, unspecified: Secondary | ICD-10-CM

## 2022-12-02 MED ORDER — METHYLPREDNISOLONE 4 MG PO TBPK: ORAL_TABLET | ORAL | 0 refills | Status: DC

## 2022-12-02 NOTE — ED Triage Notes (Signed)
Pt states he has sore throat, congestion, cough and a stiff neck X 3 days. He hasn't taken any meds.

## 2022-12-02 NOTE — Discharge Instructions (Addendum)
Patient will likely have something viral in nature Educated patient to take over-the-counter cough and cold medications and avoid any decongestants as this may elevate his blood pressure explained to patient that the symptoms may persist for 7 to 10 days Take Tylenol as needed for any pain or fever

## 2022-12-02 NOTE — ED Provider Notes (Signed)
[place]    CSN: [id number] Arrival date & time: 12/02/22  1008      History   Chief Complaint Chief Complaint  Patient presents with   Nasal Congestion   Cough   Sore Throat   Torticollis    HPI Todd Lynn is a 67 y.o. male.   Patient presents today with sore throat congestion cough stiff neck for 3 days.  Patient denies any fever no shortness of breath nonproductive cough.  States his daughter was ill with some viral a few days ago.  Patient has not taken anything prior to arrival denies chest pain.    History reviewed. No pertinent past medical history.  Patient Active Problem List   Diagnosis Date Noted   Overweight 05/14/2014    Past Surgical History:  Procedure Laterality Date   HAND SURGERY Right    NECK SURGERY         Home Medications    Prior to Admission medications   Medication Sig Start Date End Date Taking? Authorizing Provider  methylPREDNISolone (MEDROL DOSEPAK) 4 MG TBPK tablet Take as per package instructions 12/02/22   Marney Setting, NP    Family History History reviewed. No pertinent family history.  Social History Social History   Tobacco Use   Smoking status: Never   Smokeless tobacco: Current    Types: Chew  Vaping Use   Vaping Use: Never used  Substance Use Topics   Alcohol use: Yes    Alcohol/week: 0.0 standard drinks of alcohol   Drug use: No     Allergies   Patient has no known allergies.   Review of Systems Review of Systems  Constitutional:  Negative for chills and fever.  HENT:  Positive for congestion, postnasal drip, rhinorrhea, sinus pressure, sinus pain and sore throat.   Respiratory:  Positive for cough. Negative for shortness of breath and wheezing.   Cardiovascular: Negative.   Gastrointestinal: Negative.   Genitourinary: Negative.   Neurological: Negative.      Physical Exam Triage Vital Signs ED Triage Vitals  Enc Vitals Group     BP 12/02/22 1209 (!) 155/100      Pulse Rate 12/02/22 1209 63     Resp 12/02/22 1209 18     Temp 12/02/22 1209 98.2 F (36.8 C)     Temp Source 12/02/22 1209 Oral     SpO2 12/02/22 1209 97 %     Weight --      Height --      Head Circumference --      Peak Flow --      Pain Score 12/02/22 1208 0     Pain Loc --      Pain Edu? --      Excl. in [place]? --    No data found.  Updated Vital Signs BP (!) 155/100 (BP Location: Left Arm)   Pulse 63   Temp 98.2 F (36.8 C) (Oral)   Resp 18   SpO2 97%   Visual Acuity Right Eye Distance:   Left Eye Distance:   Bilateral Distance:    Right Eye Near:   Left Eye Near:    Bilateral Near:     Physical Exam Constitutional:      Appearance: He is well-developed.  HENT:     Right Ear: Tympanic membrane normal.     Left Ear: Tympanic membrane normal.     Nose: Congestion present.     Mouth/Throat:     Pharynx: Posterior  oropharyngeal erythema present. No pharyngeal swelling.     Tonsils: 1+ on the right. 1+ on the left.  Eyes:     Conjunctiva/sclera: Conjunctivae normal.  Cardiovascular:     Rate and Rhythm: Normal rate.  Pulmonary:     Effort: Pulmonary effort is normal.     Breath sounds: Normal breath sounds.  Abdominal:     Palpations: Abdomen is soft.  Musculoskeletal:     Cervical back: Normal range of motion.  Skin:    General: Skin is warm.     Capillary Refill: Capillary refill takes less than 2 seconds.  Neurological:     Mental Status: He is alert.      UC Treatments / Results  Labs (all labs ordered are listed, but only abnormal results are displayed) Labs Reviewed - No data to display  EKG   Radiology No results found.  Procedures Procedures (including critical care time)  Medications Ordered in UC Medications - No data to display  Initial Impression / Assessment and Plan / UC Course  I have reviewed the triage vital signs and the nursing notes.  Pertinent labs & imaging results that were available during my care of the patient  were reviewed by me and considered in my medical decision making (see chart for details).     Patient will likely have something viral in nature Educated patient to take over-the-counter cough and cold medications and avoid any decongestants as this may elevate his blood pressure explained to patient that the symptoms may persist for 7 to 10 days Take Tylenol as needed for any pain or fever Become worse Return for follow-up Final Clinical Impressions(s) / UC Diagnoses   Final diagnoses:  Viral URI with cough  Nasal congestion     Discharge Instructions      Patient will likely have something viral in nature Educated patient to take over-the-counter cough and cold medications and avoid any decongestants as this may elevate his blood pressure explained to patient that the symptoms may persist for 7 to 10 days Take Tylenol as needed for any pain or fever     ED Prescriptions     Medication Sig Dispense Auth. Provider   methylPREDNISolone (MEDROL DOSEPAK) 4 MG TBPK tablet Take as per package instructions 1 each Marney Setting, NP      PDMP not reviewed this encounter.   Marney Setting, NP 12/02/22 1257

## 2024-01-22 ENCOUNTER — Emergency Department (HOSPITAL_COMMUNITY)

## 2024-01-22 ENCOUNTER — Ambulatory Visit (HOSPITAL_COMMUNITY)
Admission: EM | Admit: 2024-01-22 | Discharge: 2024-01-22 | Disposition: A | Discharge status: Home / Self Care | Attending: Emergency Medicine | Admitting: Emergency Medicine

## 2024-01-22 ENCOUNTER — Inpatient Hospital Stay (HOSPITAL_COMMUNITY)
Admission: EM | Admit: 2024-01-22 | Discharge: 2024-01-23 | DRG: 321 | Disposition: A | Discharge status: Home / Self Care | Attending: Cardiovascular Disease | Admitting: Cardiovascular Disease

## 2024-01-22 ENCOUNTER — Other Ambulatory Visit: Payer: Self-pay

## 2024-01-22 ENCOUNTER — Encounter (HOSPITAL_COMMUNITY): Payer: Self-pay | Admitting: Emergency Medicine

## 2024-01-22 ENCOUNTER — Encounter (HOSPITAL_COMMUNITY)
Admission: EM | Disposition: A | Discharge status: Home / Self Care | Payer: Self-pay | Source: Home / Self Care | Attending: Cardiovascular Disease

## 2024-01-22 DIAGNOSIS — Z720 Tobacco use: Secondary | ICD-10-CM | POA: Diagnosis not present

## 2024-01-22 DIAGNOSIS — I110 Hypertensive heart disease with heart failure: Secondary | ICD-10-CM | POA: Diagnosis not present

## 2024-01-22 DIAGNOSIS — I2109 ST elevation (STEMI) myocardial infarction involving other coronary artery of anterior wall: Secondary | ICD-10-CM | POA: Diagnosis present

## 2024-01-22 DIAGNOSIS — I255 Ischemic cardiomyopathy: Secondary | ICD-10-CM | POA: Diagnosis not present

## 2024-01-22 DIAGNOSIS — I10 Essential (primary) hypertension: Secondary | ICD-10-CM | POA: Diagnosis present

## 2024-01-22 DIAGNOSIS — I2510 Atherosclerotic heart disease of native coronary artery without angina pectoris: Secondary | ICD-10-CM | POA: Diagnosis present

## 2024-01-22 DIAGNOSIS — R079 Chest pain, unspecified: Secondary | ICD-10-CM

## 2024-01-22 DIAGNOSIS — Z7902 Long term (current) use of antithrombotics/antiplatelets: Secondary | ICD-10-CM

## 2024-01-22 DIAGNOSIS — E782 Mixed hyperlipidemia: Secondary | ICD-10-CM | POA: Diagnosis not present

## 2024-01-22 DIAGNOSIS — E785 Hyperlipidemia, unspecified: Secondary | ICD-10-CM | POA: Insufficient documentation

## 2024-01-22 DIAGNOSIS — I213 ST elevation (STEMI) myocardial infarction of unspecified site: Principal | ICD-10-CM

## 2024-01-22 DIAGNOSIS — I2102 ST elevation (STEMI) myocardial infarction involving left anterior descending coronary artery: Secondary | ICD-10-CM | POA: Diagnosis not present

## 2024-01-22 DIAGNOSIS — I252 Old myocardial infarction: Secondary | ICD-10-CM

## 2024-01-22 DIAGNOSIS — Z79899 Other long term (current) drug therapy: Secondary | ICD-10-CM

## 2024-01-22 DIAGNOSIS — Z7982 Long term (current) use of aspirin: Secondary | ICD-10-CM

## 2024-01-22 DIAGNOSIS — Z955 Presence of coronary angioplasty implant and graft: Secondary | ICD-10-CM

## 2024-01-22 DIAGNOSIS — I5021 Acute systolic (congestive) heart failure: Secondary | ICD-10-CM | POA: Diagnosis present

## 2024-01-22 DIAGNOSIS — F17220 Nicotine dependence, chewing tobacco, uncomplicated: Secondary | ICD-10-CM | POA: Diagnosis present

## 2024-01-22 HISTORY — DX: Hyperlipidemia, unspecified: E78.5

## 2024-01-22 HISTORY — PX: CORONARY/GRAFT ACUTE MI REVASCULARIZATION: CATH118305

## 2024-01-22 HISTORY — PX: LEFT HEART CATH AND CORONARY ANGIOGRAPHY: CATH118249

## 2024-01-22 LAB — TROPONIN I (HIGH SENSITIVITY)
Troponin I (High Sensitivity): 379 ng/L (ref <18)
Troponin I (High Sensitivity): 464 ng/L (ref <18)
Troponin I (High Sensitivity): 398 ng/L (ref <18)

## 2024-01-22 LAB — LIPID PANEL
Cholesterol: 170 mg/dL (ref 0–200)
HDL: 45 mg/dL (ref >40)
LDL Cholesterol: 108 mg/dL — ABNORMAL HIGH (ref 0–99)
Total CHOL/HDL Ratio: 3.8 ratio
Triglycerides: 87 mg/dL (ref <150)
VLDL: 17 mg/dL (ref 0–40)

## 2024-01-22 LAB — BASIC METABOLIC PANEL
Anion gap: 7 (ref 5–15)
BUN: 17 mg/dL (ref 8–23)
CO2: 24 mmol/L (ref 22–32)
Calcium: 9.4 mg/dL (ref 8.9–10.3)
Chloride: 105 mmol/L (ref 98–111)
Creatinine, Ser: 0.92 mg/dL (ref 0.61–1.24)
GFR, Estimated: >60 mL/min (ref >60)
Glucose, Bld: 102 mg/dL — ABNORMAL HIGH (ref 70–99)
Potassium: 3.8 mmol/L (ref 3.5–5.1)
Sodium: 136 mmol/L (ref 135–145)

## 2024-01-22 LAB — POCT ACTIVATED CLOTTING TIME
Activated Clotting Time: 354 s
Activated Clotting Time: 360 s

## 2024-01-22 LAB — I-STAT CG4 LACTIC ACID, ED: Lactic Acid, Venous: 0.9 mmol/L (ref 0.5–1.9)

## 2024-01-22 LAB — CBC
HCT: 43.4 % (ref 39.0–52.0)
HCT: 44 % (ref 39.0–52.0)
Hemoglobin: 14.3 g/dL (ref 13.0–17.0)
Hemoglobin: 14.5 g/dL (ref 13.0–17.0)
MCH: 27.9 pg (ref 26.0–34.0)
MCH: 27.6 pg (ref 26.0–34.0)
MCHC: 32.9 g/dL (ref 30.0–36.0)
MCHC: 33 g/dL (ref 30.0–36.0)
MCV: 84.8 fL (ref 80.0–100.0)
MCV: 83.8 fL (ref 80.0–100.0)
Platelets: 292 10*3/uL (ref 150–400)
Platelets: 277 10*3/uL (ref 150–400)
RBC: 5.12 MIL/uL (ref 4.22–5.81)
RBC: 5.25 MIL/uL (ref 4.22–5.81)
RDW: 14.4 % (ref 11.5–15.5)
RDW: 14.4 % (ref 11.5–15.5)
WBC: 9.3 10*3/uL (ref 4.0–10.5)
WBC: 11.5 10*3/uL — ABNORMAL HIGH (ref 4.0–10.5)
nRBC: 0 % (ref 0.0–0.2)
nRBC: 0 % (ref 0.0–0.2)

## 2024-01-22 LAB — APTT: aPTT: 143 s — ABNORMAL HIGH (ref 24–36)

## 2024-01-22 LAB — PROTIME-INR
INR: 1.1 (ref 0.8–1.2)
Prothrombin Time: 14 s (ref 11.4–15.2)

## 2024-01-22 LAB — HEMOGLOBIN A1C
Hgb A1c MFr Bld: 6.2 % — ABNORMAL HIGH (ref 4.8–5.6)
Mean Plasma Glucose: 131.24 mg/dL

## 2024-01-22 LAB — CREATININE, SERUM
Creatinine, Ser: 0.91 mg/dL (ref 0.61–1.24)
GFR, Estimated: >60 mL/min (ref >60)

## 2024-01-22 SURGERY — CORONARY/GRAFT ACUTE MI REVASCULARIZATION
Anesthesia: LOCAL

## 2024-01-22 MED ORDER — HEPARIN (PORCINE) IN NACL 1000-0.9 UT/500ML-% IV SOLN
INTRAVENOUS | Status: DC | PRN
  Administered 2024-01-22 (×2): 500 mL

## 2024-01-22 MED ORDER — SODIUM CHLORIDE 0.9% FLUSH: 3.0000 mL | INTRAVENOUS | Status: DC | PRN

## 2024-01-22 MED ORDER — HEPARIN SODIUM (PORCINE) 1000 UNIT/ML IJ SOLN
INTRAMUSCULAR | Status: AC
  Filled 2024-01-22: qty 10

## 2024-01-22 MED ORDER — LIDOCAINE HCL (PF) 1 % IJ SOLN
INTRAMUSCULAR | Status: DC | PRN
  Administered 2024-01-22: 2 mL

## 2024-01-22 MED ORDER — SODIUM CHLORIDE 0.9 % IV SOLN: 250.0000 mL | INTRAVENOUS | Status: DC | PRN

## 2024-01-22 MED ORDER — ONDANSETRON HCL 4 MG/2ML IJ SOLN
4.0000 mg | Freq: Four times a day (QID) | INTRAMUSCULAR | Status: DC | PRN
Start: 2024-01-22 — End: 2024-01-23

## 2024-01-22 MED ORDER — ENOXAPARIN SODIUM 40 MG/0.4ML IJ SOSY
40.0000 mg | PREFILLED_SYRINGE | INTRAMUSCULAR | Status: DC
  Administered 2024-01-23: 40 mg via SUBCUTANEOUS
  Filled 2024-01-22: qty 0.4

## 2024-01-22 MED ORDER — NITROGLYCERIN 1 MG/10 ML FOR IR/CATH LAB
INTRA_ARTERIAL | Status: DC | PRN
  Administered 2024-01-22: 200 ug via INTRACORONARY

## 2024-01-22 MED ORDER — PNEUMOCOCCAL 20-VAL CONJ VACC 0.5 ML IM SUSY
0.5000 mL | PREFILLED_SYRINGE | INTRAMUSCULAR | Status: DC
  Filled 2024-01-22: qty 0.5

## 2024-01-22 MED ORDER — ORAL CARE MOUTH RINSE: 15.0000 mL | OROMUCOSAL | Status: DC | PRN

## 2024-01-22 MED ORDER — HEPARIN SODIUM (PORCINE) 1000 UNIT/ML IJ SOLN
INTRAMUSCULAR | Status: DC | PRN
  Administered 2024-01-22: 7000 [IU] via INTRAVENOUS

## 2024-01-22 MED ORDER — NITROGLYCERIN 1 MG/10 ML FOR IR/CATH LAB
INTRA_ARTERIAL | Status: AC
  Filled 2024-01-22: qty 10

## 2024-01-22 MED ORDER — VERAPAMIL HCL 2.5 MG/ML IV SOLN
INTRAVENOUS | Status: DC | PRN
  Administered 2024-01-22: 10 mL via INTRA_ARTERIAL

## 2024-01-22 MED ORDER — ASPIRIN 325 MG PO TABS: 325.0000 mg | ORAL_TABLET | Freq: Every day | ORAL | Status: DC

## 2024-01-22 MED ORDER — ASPIRIN 81 MG PO CHEW
81.0000 mg | CHEWABLE_TABLET | Freq: Every day | ORAL | Status: DC
  Administered 2024-01-23: 81 mg via ORAL
  Filled 2024-01-22: qty 1

## 2024-01-22 MED ORDER — PRASUGREL HCL 10 MG PO TABS
ORAL_TABLET | ORAL | Status: DC | PRN
  Administered 2024-01-22: 60 mg via ORAL

## 2024-01-22 MED ORDER — SODIUM CHLORIDE 0.9 % IV SOLN: INTRAVENOUS | Status: AC

## 2024-01-22 MED ORDER — LOSARTAN POTASSIUM 25 MG PO TABS
25.0000 mg | ORAL_TABLET | Freq: Every day | ORAL | Status: DC
  Administered 2024-01-22 – 2024-01-23 (×2): 25 mg via ORAL
  Filled 2024-01-22 (×2): qty 1

## 2024-01-22 MED ORDER — ASPIRIN 81 MG PO CHEW
324.0000 mg | CHEWABLE_TABLET | Freq: Once | ORAL | Status: AC
  Administered 2024-01-22: 324 mg via ORAL

## 2024-01-22 MED ORDER — IOHEXOL 350 MG/ML SOLN
INTRAVENOUS | Status: DC | PRN
  Administered 2024-01-22: 150 mL via INTRA_ARTERIAL

## 2024-01-22 MED ORDER — MIDAZOLAM HCL 2 MG/2ML IJ SOLN
INTRAMUSCULAR | Status: AC
  Filled 2024-01-22: qty 2

## 2024-01-22 MED ORDER — MIDAZOLAM HCL 2 MG/2ML IJ SOLN
INTRAMUSCULAR | Status: DC | PRN
  Administered 2024-01-22: 1 mg via INTRAVENOUS

## 2024-01-22 MED ORDER — VERAPAMIL HCL 2.5 MG/ML IV SOLN
INTRAVENOUS | Status: AC
  Filled 2024-01-22: qty 2

## 2024-01-22 MED ORDER — HEPARIN SODIUM (PORCINE) 5000 UNIT/ML IJ SOLN
4000.0000 [IU] | Freq: Once | INTRAMUSCULAR | Status: AC
  Administered 2024-01-22: 4000 [IU] via INTRAVENOUS

## 2024-01-22 MED ORDER — PRASUGREL HCL 10 MG PO TABS
10.0000 mg | ORAL_TABLET | Freq: Every day | ORAL | Status: DC
  Administered 2024-01-23: 10 mg via ORAL
  Filled 2024-01-22: qty 1

## 2024-01-22 MED ORDER — SODIUM CHLORIDE 0.9% FLUSH
3.0000 mL | Freq: Two times a day (BID) | INTRAVENOUS | Status: DC
  Administered 2024-01-22 – 2024-01-23 (×2): 3 mL via INTRAVENOUS

## 2024-01-22 MED ORDER — ATORVASTATIN CALCIUM 80 MG PO TABS
80.0000 mg | ORAL_TABLET | Freq: Every day | ORAL | Status: DC
  Administered 2024-01-22 – 2024-01-23 (×2): 80 mg via ORAL
  Filled 2024-01-22 (×2): qty 1

## 2024-01-22 MED ORDER — CARVEDILOL 3.125 MG PO TABS
3.1250 mg | ORAL_TABLET | Freq: Two times a day (BID) | ORAL | Status: DC
  Administered 2024-01-22 – 2024-01-23 (×2): 3.125 mg via ORAL
  Filled 2024-01-22 (×2): qty 1

## 2024-01-22 MED ORDER — LIDOCAINE HCL (PF) 1 % IJ SOLN
INTRAMUSCULAR | Status: AC
  Filled 2024-01-22: qty 30

## 2024-01-22 MED ORDER — SODIUM CHLORIDE 0.9 % IV SOLN
INTRAVENOUS | Status: AC | PRN
Start: 2024-01-22 — End: 2024-01-22
  Administered 2024-01-22: 10 mL/h via INTRAVENOUS

## 2024-01-22 MED ORDER — PRASUGREL HCL 10 MG PO TABS
ORAL_TABLET | ORAL | Status: AC
  Filled 2024-01-22: qty 1

## 2024-01-22 MED ORDER — ACETAMINOPHEN 325 MG PO TABS: 650.0000 mg | ORAL_TABLET | ORAL | Status: DC | PRN

## 2024-01-22 SURGICAL SUPPLY — 20 items
BALL SAPPHIRE NC24 3.0X15 (BALLOONS) ×1 IMPLANT
BALLN EMERGE MR 2.5X12 (BALLOONS) ×1 IMPLANT
BALLN EMERGE MR 3.5X12 (BALLOONS) IMPLANT
BALLOON EMERGE MR 2.5X12 (BALLOONS) IMPLANT
BALLOON EMERGE MR 3.5X12 (BALLOONS) IMPLANT
BALLOON SAPPHIRE NC24 3.0X15 (BALLOONS) IMPLANT
CATH INFINITI AMBI 5FR JK (CATHETERS) IMPLANT
CATH LAUNCHER 6FR EBU3.5 (CATHETERS) IMPLANT
DEVICE RAD COMP TR BAND LRG (VASCULAR PRODUCTS) IMPLANT
GLIDESHEATH SLEND SS 6F .021 (SHEATH) IMPLANT
GUIDEWIRE INQWIRE 1.5J.035X260 (WIRE) IMPLANT
INQWIRE 1.5J .035X260CM (WIRE) ×1 IMPLANT
KIT ENCORE 26 ADVANTAGE (KITS) IMPLANT
KIT SYRINGE INJ CVI SPIKEX1 (MISCELLANEOUS) IMPLANT
PACK CARDIAC CATHETERIZATION (CUSTOM PROCEDURE TRAY) ×1 IMPLANT
SET ATX-X65L (MISCELLANEOUS) IMPLANT
STENT SYNERGY XD 2.75X20 (Permanent Stent) IMPLANT
SYNERGY XD 2.75X20 (Permanent Stent) ×1 IMPLANT
TUBING CIL FLEX 10 FLL-RA (TUBING) IMPLANT
WIRE RUNTHROUGH .014X180CM (WIRE) IMPLANT

## 2024-01-22 NOTE — ED Provider Notes (Signed)
 [facility]    CSN: [id number] Arrival date & time: 01/22/24  1205      History   Chief Complaint Chief Complaint  Patient presents with   Chest Pain    HPI Todd Lynn is a 68 y.o. male.  Chest pressure started yesterday after 3 mile walk Located left side. Still feeling today but not as bad. Rates 3/10 currently.  Feels it increases with deep breath. Not feeling short of breath.  Denies cardiac history   Thinks it's a muscle spasm from working out, but wanted to make sure  Past Medical History:  Diagnosis Date   Hyperlipidemia     Patient Active Problem List   Diagnosis Date Noted   Overweight 05/14/2014    Past Surgical History:  Procedure Laterality Date   HAND SURGERY Right    NECK SURGERY         Home Medications    Prior to Admission medications   Medication Sig Start Date End Date Taking? Authorizing Provider  methylPREDNISolone (MEDROL DOSEPAK) 4 MG TBPK tablet Take as per package instructions 12/02/22   Coralyn Mark, NP    Family History No family history on file.  Social History Social History   Tobacco Use   Smoking status: Never   Smokeless tobacco: Current    Types: Chew  Vaping Use   Vaping status: Never Used  Substance Use Topics   Alcohol use: Yes    Alcohol/week: 0.0 standard drinks of alcohol   Drug use: No     Allergies   Patient has no known allergies.   Review of Systems Review of Systems  Cardiovascular:  Positive for chest pain.     Physical Exam Triage Vital Signs ED Triage Vitals [01/22/24 1236]  Encounter Vitals Group     BP (!) 127/90     Systolic BP Percentile      Diastolic BP Percentile      Pulse Rate 76     Resp 16     Temp 98.3 F (36.8 C)     Temp Source Oral     SpO2 100 %     Weight      Height      Head Circumference      Peak Flow      Pain Score 3     Pain Loc      Pain Education      Exclude from Growth Chart    No data found.  Updated Vital Signs BP  (!) 127/90 (BP Location: Right Arm)   Pulse 76   Temp 98.3 F (36.8 C) (Oral)   Resp 16   SpO2 100%    Physical Exam Vitals and nursing note reviewed.  Constitutional:      General: He is not in acute distress.    Appearance: Normal appearance. He is not ill-appearing, toxic-appearing or diaphoretic.  HENT:     Mouth/Throat:     Mouth: Mucous membranes are moist.     Pharynx: Oropharynx is clear.  Eyes:     Conjunctiva/sclera: Conjunctivae normal.  Cardiovascular:     Rate and Rhythm: Normal rate and regular rhythm.     Pulses: Normal pulses.          Radial pulses are 2+ on the right side and 2+ on the left side.     Heart sounds: Normal heart sounds.  Pulmonary:     Effort: Pulmonary effort is normal.     Breath sounds: Normal breath  sounds.  Abdominal:     Palpations: Abdomen is soft.  Musculoskeletal:     Cervical back: Normal range of motion.  Skin:    General: Skin is warm and dry.  Neurological:     Mental Status: He is alert and oriented to person, place, and time.      UC Treatments / Results  Labs (all labs ordered are listed, but only abnormal results are displayed) Labs Reviewed - No data to display  EKG   Radiology DG Chest Portable 1 View Result Date: 01/22/2024 CLINICAL DATA:  Chest pain EXAM: PORTABLE CHEST 1 VIEW COMPARISON:  05/14/2014 FINDINGS: AP portable radiograph. Numerous leads and wires project over the chest. Midline trachea. Normal heart size. No pleural effusion or pneumothorax. Clear lungs. IMPRESSION: No active disease. Electronically Signed   By: Jeronimo Greaves M.D.   On: 01/22/2024 14:23    Procedures Procedures (including critical care time)  Medications Ordered in UC Medications - No data to display  Initial Impression / Assessment and Plan / UC Course  I have reviewed the triage vital signs and the nursing notes.  Pertinent labs & imaging results that were available during my care of the patient were reviewed by me and  considered in my medical decision making (see chart for details).  Stable vitals  He is overall well appearing  EKG normal sinus with ventricular rate 67 bpm.  There is ST elevation in lead III however no reciprocal changes are noted.  Only prior comparison is from 10 years ago.  68 year old male with new onset chest pressure after exertion. I have advised evaluation in the emergency department, needs troponins to rule out cardiac abnormality. He is agreeable to ED eval, sent via POV as hospital is across parking lot from this clinic.   Final Clinical Impressions(s) / UC Diagnoses   Final diagnoses:  Chest pain, unspecified type   Discharge Instructions   None    ED Prescriptions   None    PDMP not reviewed this encounter.   Marlow Baars, [place] 01/22/24 1444

## 2024-01-22 NOTE — ED Notes (Signed)
 Patient is being discharged from the Urgent Care and sent to the Emergency Department via pov . 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 Patient is aware and verbalizes understanding of plan of care.  Vitals:   01/22/24 1236  BP: (!) 127/90  Pulse: 76  Resp: 16  Temp: 98.3 F (36.8 C)  SpO2: 100%

## 2024-01-22 NOTE — ED Triage Notes (Signed)
 Pt sent from UC for troponin check after pt reporting chest tightness since yesterday after walking. Pt endorses some mild pain under left pec.  Pt does go to gym and wants to make sure it isn't just a pulled muscle.

## 2024-01-22 NOTE — ED Provider Notes (Signed)
 Emergency Department Provider Note   I have reviewed the triage vital signs and the nursing notes.   HISTORY  Chief Complaint Chest Pain   HPI Todd Lynn is a 68 y.o. male past history of hyperlipidemia presents to the emergency department from urgent care with chest pressure and an abnormal EKG.  Patient states he was on a walk yesterday and developed some central chest heaviness/tightness.  Some pain radiating into the left upper chest.  Initially thought it could have been a pulled muscle but symptoms persisted into the morning and so presents initially to urgent care.  They performed an EKG showing ST changes inferiorly and advised that he present immediately to the emergency department, which he did.  He is currently having some very mild heaviness in the center of the chest.  No diaphoresis or nausea.  No prior history of ACS.   Past Medical History:  Diagnosis Date   Hyperlipidemia     Review of Systems  Constitutional: No fever/chills Cardiovascular: Positive chest pain. Respiratory: Denies shortness of breath. Gastrointestinal: No abdominal pain.  No nausea, no vomiting.   Skin: Negative for rash. Neurological: Negative for headaches.  ____________________________________________   PHYSICAL EXAM:  VITAL SIGNS: ED Triage Vitals  Encounter Vitals Group     BP      Systolic BP Percentile      Diastolic BP Percentile      Pulse      Resp      Temp      Temp src      SpO2      Weight      Height      Head Circumference      Peak Flow      Pain Score      Pain Loc      Pain Education      Exclude from Growth Chart     Constitutional: Alert and oriented. Well appearing and in no acute distress. Eyes: Conjunctivae are normal.  Head: Atraumatic. Nose: No congestion/rhinnorhea. Mouth/Throat: Mucous membranes are moist.  Neck: No stridor.  Cardiovascular: Normal rate, regular rhythm. Good peripheral circulation. Grossly normal heart sounds.    Respiratory: Normal respiratory effort.  No retractions. Lungs CTAB. Gastrointestinal: Soft and nontender. No distention.  Musculoskeletal: No gross deformities of extremities. Neurologic:  Normal speech and language.  Skin:  Skin is warm, dry and intact. No rash noted.   ____________________________________________   LABS (all labs ordered are listed, but only abnormal results are displayed)  Labs Reviewed  BASIC METABOLIC PANEL  CBC  HEMOGLOBIN A1C  PROTIME-INR  APTT  LIPID PANEL  I-STAT CG4 LACTIC ACID, ED  TROPONIN I (HIGH SENSITIVITY)   ____________________________________________  EKG   EKG Interpretation Date/Time:    Ventricular Rate:    PR Interval:    QRS Duration:    QT Interval:    QTC Calculation:   R Axis:      Text Interpretation:          ____________________________________________  RADIOLOGY  No results found.  ____________________________________________   PROCEDURES  Procedure(s) performed:   Procedures  CRITICAL CARE Performed by: Maia Plan Total critical care time: 35 minutes Critical care time was exclusive of separately billable procedures and treating other patients. Critical care was necessary to treat or prevent imminent or life-threatening deterioration. Critical care was time spent personally by me on the following activities: development of treatment plan with patient and/or surrogate as well as nursing, discussions with  consultants, evaluation of patient's response to treatment, examination of patient, obtaining history from patient or surrogate, ordering and performing treatments and interventions, ordering and review of laboratory studies, ordering and review of radiographic studies, pulse oximetry and re-evaluation of patient's condition.  Alona Bene, MD Emergency Medicine  ____________________________________________   INITIAL IMPRESSION / ASSESSMENT AND PLAN / ED COURSE  Pertinent labs & imaging results  that were available during my care of the patient were reviewed by me and considered in my medical decision making (see chart for details).   This patient is Presenting for Evaluation of ***, which {Range:23949} require a range of treatment options, and {MDMcomplaint:23950} a complaint that involves a {MDMlevelrisk:23951} risk of morbidity and mortality.  The Differential Diagnoses include***.  Critical Interventions-    Medications  0.9 %  sodium chloride infusion (has no administration in time range)  aspirin chewable tablet 324 mg (has no administration in time range)  heparin injection 60 Units/kg (has no administration in time range)  aspirin tablet 325 mg (has no administration in time range)    Reassessment after intervention:     I *** Additional Historical Information from ***, as the patient is ***.  I decided to review pertinent External Data, and in summary ***.   Clinical Laboratory Tests Ordered, included   Radiologic Tests Ordered, included ***. I independently interpreted the images and agree with radiology interpretation.   Cardiac Monitor Tracing which shows ***   Social Determinants of Health Risk ***  Consult complete with Code STEMI activated from triage after my assessment. Spoke with Dr. Kirke Corin who is coming in for evaluation.   Medical Decision Making: Summary: ***  Reevaluation with update and discussion with   ***Considered admission***  Patient's presentation is most consistent with {EM COPA:27473}   Disposition:   ____________________________________________  FINAL CLINICAL IMPRESSION(S) / ED DIAGNOSES  Final diagnoses:  None     NEW OUTPATIENT MEDICATIONS STARTED DURING THIS VISIT:  New Prescriptions   No medications on file    Note:  This document was prepared using Dragon voice recognition software and may include unintentional dictation errors.  Alona Bene, MD, [facility] Emergency Medicine

## 2024-01-22 NOTE — ED Triage Notes (Signed)
 Pt reports left chest tightness since yesterday after walking. Denies any cardiac hx.

## 2024-01-22 NOTE — ED Provider Notes (Deleted)
 Chest pressure started yesterday after 3 mile walk Located left side. Still feeling today but not as bad. Rates 3/10 currently.  Feels it increases with deep breath. Not short of breath.  EKG normal sinus with ventricular rate 67 bpm.  There is ST elevation in lead III however no reciprocal changes are noted.  Only prior comparison is from 10 years ago.  68 year old male with new onset chest pressure after exertion. I have advised evaluation in the emergency department, needs troponins to rule out cardiac abnormality. He is agreeable to ED eval, sent via POV   Bular Hickok, Ray Church 01/22/24 1256

## 2024-01-22 NOTE — H&P (Signed)
 Cardiology Admission History and Physical   Patient ID: Todd Lynn MRN: [id number]; DOB: 10/27/1956   Admission date: 01/22/2024  PCP:  Camie Patience, FNP   [facility] Providers Cardiologist:  None   {    Chief Complaint: Chest tightness  Patient Profile:   Todd Lynn is a 68 y.o. male with no prior cardiac history who is being seen 01/22/2024 for the evaluation of chest pain and ST elevation myocardial infarction.  History of Present Illness:   Todd Lynn is a 68 year old male with past medical history of untreated hypertension and hyperlipidemia.  In addition, he chews tobacco.  He presented with intermittent chest pain and tightness that started yesterday with activities.  The pain was overall mild and he thought that he pulled a muscle.  He went to urgent care today and his EKG showed borderline ST changes in the inferior leads.  He was discharged and asked to come to the ED for evaluation.  His EKG showed 1 mm of ST elevation starting in V3-V6 as well as the inferior leads.  He was in no distress but was still having chest discomfort and thus a code STEMI was activated.  He was given aspirin and 4000 units of unfractionated heparin.   Past Medical History:  Diagnosis Date   Hyperlipidemia     Past Surgical History:  Procedure Laterality Date   HAND SURGERY Right    NECK SURGERY       Medications Prior to Admission: Prior to Admission medications   Medication Sig Start Date End Date Taking? Authorizing Provider  methylPREDNISolone (MEDROL DOSEPAK) 4 MG TBPK tablet Take as per package instructions 12/02/22   Coralyn Mark, NP     Allergies:   No Known Allergies  Social History:   Social History   Socioeconomic History   Marital status: Single    Spouse name: Not on file   Number of children: Not on file   Years of education: Not on file   Highest education level: Not on file  Occupational History   Not on file  Tobacco Use    Smoking status: Never   Smokeless tobacco: Current    Types: Chew  Vaping Use   Vaping status: Never Used  Substance and Sexual Activity   Alcohol use: Yes    Alcohol/week: 0.0 standard drinks of alcohol   Drug use: No   Sexual activity: Not Currently  Other Topics Concern   Not on file  Social History Narrative   Not on file   Social Drivers of Health   Financial Resource Strain: Not on file  Food Insecurity: Not on file  Transportation Needs: Not on file  Physical Activity: Not on file  Stress: Not on file  Social Connections: Not on file  Intimate Partner Violence: Not on file    Family History:   The patient's family history is not on file.    ROS:  Please see the history of present illness.  All other ROS reviewed and negative.     Physical Exam/Data:   Vitals:   01/22/24 1504 01/22/24 1509 01/22/24 1514 01/22/24 1525  BP: (!) 143/99 (!) 143/99  (!) 152/105  Pulse: 68 (!) 0 (!) 0 66  Resp: 19 19    Temp:      TempSrc:      SpO2: 98% 98%  95%  Weight:      Height:        Intake/Output Summary (Last 24 hours)  at 01/22/2024 1547 Last data filed at 01/22/2024 1530 Gross per 24 hour  Intake --  Output 750 ml  Net -750 ml      01/22/2024    1:00 PM 08/25/2020   11:17 AM 10/05/2018    2:49 PM  Last 3 Weights  Weight (lbs) 220 lb 7.4 oz 215 lb 214 lb  Weight (kg) 100 kg 97.523 kg 97.07 kg     Body mass index is 33.52 kg/m.  General:  Well nourished, well developed, in no acute distress HEENT: normal Neck: no JVD Vascular: No carotid bruits; Distal pulses 2+ bilaterally   Cardiac:  normal S1, S2; RRR; no murmur  Lungs:  clear to auscultation bilaterally, no wheezing, rhonchi or rales  Abd: soft, nontender, no hepatomegaly  Ext: no edema Musculoskeletal:  No deformities, BUE and BLE strength normal and equal Skin: warm and dry  Neuro:  CNs 2-12 intact, no focal abnormalities noted Psych:  Normal affect    EKG:  The ECG that was done  was personally  reviewed and demonstrates normal sinus rhythm with 1 mm of ST elevation starting in V3-V6 and inferior leads  Relevant CV Studies:   Laboratory Data:  High Sensitivity Troponin:   Recent Labs  Lab 01/22/24 1322  TROPONINIHS 379*      Chemistry Recent Labs  Lab 01/22/24 1322  NA 136  K 3.8  CL 105  CO2 24  GLUCOSE 102*  BUN 17  CREATININE 0.92  CALCIUM 9.4  GFRNONAA >60  ANIONGAP 7    No results for input(s): "PROT", "ALBUMIN", "AST", "ALT", "ALKPHOS", "BILITOT" in the last 168 hours. Lipids  Recent Labs  Lab 01/22/24 1328  CHOL 170  TRIG 87  HDL 45  LDLCALC 108*  CHOLHDL 3.8   Hematology Recent Labs  Lab 01/22/24 1322  WBC 9.3  RBC 5.12  HGB 14.3  HCT 43.4  MCV 84.8  MCH 27.9  MCHC 32.9  RDW 14.4  PLT 292   Thyroid No results for input(s): "TSH", "FREET4" in the last 168 hours. BNPNo results for input(s): "BNP", "PROBNP" in the last 168 hours.  DDimer No results for input(s): "DDIMER" in the last 168 hours.   Radiology/Studies:  CARDIAC CATHETERIZATION Result Date: 01/22/2024   Ramus lesion is 95% stenosed.   Ost LAD lesion is 50% stenosed.   Prox LAD to Mid LAD lesion is 95% stenosed.   Mid LAD lesion is 30% stenosed.   Dist LAD lesion is 80% stenosed.   Prox LAD lesion is 30% stenosed.   Dist LM lesion is 20% stenosed.   Mid RCA lesion is 30% stenosed.   Ost RCA lesion is 20% stenosed.   A drug-eluting stent was successfully placed using a SYNERGY XD [id number].   Post intervention, there is a 0% residual stenosis.   There is moderate to severe left ventricular systolic dysfunction.   LV end diastolic pressure is moderately elevated.   The left ventricular ejection fraction is 25-35% by visual estimate.   In the absence of any other complications or medical issues, we expect the patient to be ready for discharge from an interventional cardiology perspective on 01/24/2024.   Recommend uninterrupted dual antiplatelet therapy with Aspirin 81mg  daily and  Prasugrel 10mg  daily for a minimum of 12 months (ACS-Class I recommendation). 1.  Anterior STEMI with inferior changes likely due to wraparound LAD.  The culprit is plaque rupture in the proximal/mid LAD.  In addition, there is severe ostial stenosis in the ramus  artery and moderate ostial LAD disease. 2.  Moderately to severely reduced LV systolic function with moderately elevated left ventricular end-diastolic pressure at 24 mmHg. 3.  Successful angioplasty and drug-eluting stent placement to the proximal/mid LAD. Recommendations: The ramus is not suitable for PCI due to ostial location and presence of moderate ostial LAD disease.  Recommend treatment of residual coronary artery disease medically.   DG Chest Portable 1 View Result Date: 01/22/2024 CLINICAL DATA:  Chest pain EXAM: PORTABLE CHEST 1 VIEW COMPARISON:  05/14/2014 FINDINGS: AP portable radiograph. Numerous leads and wires project over the chest. Midline trachea. Normal heart size. No pleural effusion or pneumothorax. Clear lungs. IMPRESSION: No active disease. Electronically Signed   By: Jeronimo Greaves M.D.   On: 01/22/2024 14:23     Assessment and Plan:   Anterior ST elevation myocardial infarction with inferior changes likely due to wraparound LAD: Emergent cardiac catheterization was performed via the right radial artery which showed subtotal occlusion of the proximal/mid LAD.  This was treated successfully with PCI and drug-eluting stent placement.  In addition, there is residual severe stenosis in ostial ramus which is not suitable for PCI given location and moderate ostial LAD disease.  Recommend treating his residual coronary artery disease medically.  Recommend aspirin indefinitely and prasugrel for at least 12 months. 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  He is not dyspneic and will only diurese if he develops symptoms.  I  suspect stunned myocardium and his EF will likely improve in the near future.  I started small dose carvedilol and losartan. Hyperlipidemia: I started high-dose atorvastatin. Tobacco use: He does not smoke but he chews.  I discussed the importance of abstinence.   Risk Assessment/Risk Scores:    TIMI Risk Score for ST  Elevation MI:   The patient's TIMI risk score is 4, which indicates a 7.3% risk of all cause mortality at 30 days.{     Code Status: Full Code  Severity of Illness: The appropriate patient status for this patient is INPATIENT. Inpatient status is judged to be reasonable and necessary in order to provide the required intensity of service to ensure the patient's safety. The patient's presenting symptoms, physical exam findings, and initial radiographic and laboratory data in the context of their chronic comorbidities is felt to place them at high risk for further clinical deterioration. Furthermore, it is not anticipated that the patient will be medically stable for discharge from the hospital within 2 midnights of admission.   * I certify that at the point of admission it is my clinical judgment that the patient will require inpatient hospital care spanning beyond 2 midnights from the point of admission due to high intensity of service, high risk for further deterioration and high frequency of surveillance required.*   For questions or updates, please contact [facility] Please consult [email or web address] for contact info under     Signed, Lorine Bears, MD  01/22/2024 3:47 PM

## 2024-01-23 ENCOUNTER — Encounter (HOSPITAL_COMMUNITY): Payer: Self-pay | Admitting: Cardiovascular Disease

## 2024-01-23 ENCOUNTER — Telehealth: Payer: Self-pay | Admitting: Cardiology

## 2024-01-23 ENCOUNTER — Telehealth (HOSPITAL_COMMUNITY): Payer: Self-pay | Admitting: Pharmacy Technician

## 2024-01-23 ENCOUNTER — Other Ambulatory Visit (HOSPITAL_COMMUNITY): Payer: Self-pay

## 2024-01-23 ENCOUNTER — Inpatient Hospital Stay (HOSPITAL_COMMUNITY)

## 2024-01-23 DIAGNOSIS — I2109 ST elevation (STEMI) myocardial infarction involving other coronary artery of anterior wall: Secondary | ICD-10-CM

## 2024-01-23 DIAGNOSIS — I255 Ischemic cardiomyopathy: Secondary | ICD-10-CM | POA: Insufficient documentation

## 2024-01-23 DIAGNOSIS — E785 Hyperlipidemia, unspecified: Secondary | ICD-10-CM | POA: Insufficient documentation

## 2024-01-23 LAB — BASIC METABOLIC PANEL
Anion gap: 11 (ref 5–15)
BUN: 15 mg/dL (ref 8–23)
CO2: 19 mmol/L — ABNORMAL LOW (ref 22–32)
Calcium: 8.5 mg/dL — ABNORMAL LOW (ref 8.9–10.3)
Chloride: 107 mmol/L (ref 98–111)
Creatinine, Ser: 0.95 mg/dL (ref 0.61–1.24)
GFR, Estimated: >60 mL/min (ref >60)
Glucose, Bld: 89 mg/dL (ref 70–99)
Potassium: 3.7 mmol/L (ref 3.5–5.1)
Sodium: 137 mmol/L (ref 135–145)

## 2024-01-23 LAB — MRSA NEXT GEN BY PCR, NASAL: MRSA by PCR Next Gen: NOT DETECTED

## 2024-01-23 LAB — HEPATIC FUNCTION PANEL
ALT: 25 U/L (ref 0–44)
AST: 19 U/L (ref 15–41)
Albumin: 2.9 g/dL — ABNORMAL LOW (ref 3.5–5.0)
Alkaline Phosphatase: 55 U/L (ref 38–126)
Bilirubin, Direct: <0.1 mg/dL (ref 0.0–0.2)
Total Bilirubin: 0.6 mg/dL (ref 0.0–1.2)
Total Protein: 6.4 g/dL — ABNORMAL LOW (ref 6.5–8.1)

## 2024-01-23 LAB — CBC
HCT: 42 % (ref 39.0–52.0)
Hemoglobin: 13.8 g/dL (ref 13.0–17.0)
MCH: 27.5 pg (ref 26.0–34.0)
MCHC: 32.9 g/dL (ref 30.0–36.0)
MCV: 83.7 fL (ref 80.0–100.0)
Platelets: 259 10*3/uL (ref 150–400)
RBC: 5.02 MIL/uL (ref 4.22–5.81)
RDW: 14.3 % (ref 11.5–15.5)
WBC: 9.2 10*3/uL (ref 4.0–10.5)
nRBC: 0 % (ref 0.0–0.2)

## 2024-01-23 LAB — ECHOCARDIOGRAM COMPLETE
Area-P 1/2: 2.08 cm2
Calc EF: 52.7 %
Height: 68 in
S' Lateral: 3.1 cm
Single Plane A2C EF: 47.7 %
Single Plane A4C EF: 55.8 %
Weight: 3527.36 [oz_av]

## 2024-01-23 MED ORDER — LOSARTAN POTASSIUM 25 MG PO TABS
25.0000 mg | ORAL_TABLET | Freq: Every day | ORAL | 0 refills | Status: DC
  Filled 2024-01-23: qty 90, 90d supply, fill #0

## 2024-01-23 MED ORDER — CARVEDILOL 3.125 MG PO TABS
3.1250 mg | ORAL_TABLET | Freq: Two times a day (BID) | ORAL | 1 refills | Status: DC
  Filled 2024-01-23: qty 60, 30d supply, fill #0

## 2024-01-23 MED ORDER — ASPIRIN 81 MG PO CHEW
81.0000 mg | CHEWABLE_TABLET | Freq: Every day | ORAL | 2 refills | Status: AC
  Filled 2024-01-23: qty 90, 90d supply, fill #0

## 2024-01-23 MED ORDER — PRASUGREL HCL 10 MG PO TABS
10.0000 mg | ORAL_TABLET | Freq: Every day | ORAL | 2 refills | Status: DC
  Filled 2024-01-23: qty 30, 30d supply, fill #0
  Filled 2024-02-23: qty 30, 30d supply, fill #1
  Filled 2024-03-23: qty 30, 30d supply, fill #0
  Filled 2024-03-23: qty 30, 30d supply, fill #2
  Filled 2024-04-19: qty 30, 30d supply, fill #1
  Filled 2024-05-23: qty 30, 30d supply, fill #2
  Filled 2024-06-21: qty 30, 30d supply, fill #3
  Filled 2024-07-19: qty 30, 30d supply, fill #4
  Filled 2024-08-19: qty 30, 30d supply, fill #5
  Filled 2024-09-24 – 2024-09-25 (×3): qty 30, 30d supply, fill #6

## 2024-01-23 MED ORDER — NITROGLYCERIN 0.4 MG SL SUBL
0.4000 mg | SUBLINGUAL_TABLET | SUBLINGUAL | 2 refills | Status: DC | PRN
  Filled 2024-01-23: qty 25, 7d supply, fill #0

## 2024-01-23 MED ORDER — ATORVASTATIN CALCIUM 80 MG PO TABS
80.0000 mg | ORAL_TABLET | Freq: Every day | ORAL | 1 refills | Status: DC
  Filled 2024-01-23 – 2024-04-19 (×2): qty 90, 90d supply, fill #0

## 2024-01-23 NOTE — Progress Notes (Signed)
 CARDIAC REHAB PHASE I    Pt resting in bed, feeling well today. Pt ambulated in hallway twice this am. Reports tolerated mobility well with no CP, dizziness or SOB. Post MI/stent education including restrictions, risk factors, exercise guidelines, antiplatelet therapy importance, MI booklet, NTG use, heart healthy diet, smoking cessation and CRP2 reviewed. All questions and concerns addressed. Will refer to [facility] for CRP2.will continue to follow.   0900-1030  Woodroe Chen, RN BSN 01/23/2024 10:27 AM

## 2024-01-23 NOTE — Telephone Encounter (Signed)
 Todd Lynn

## 2024-01-23 NOTE — Telephone Encounter (Signed)
   Transition of Care Follow-up Phone Call Request    Patient Name: SOREN LAZARZ Date of Birth: [birth year] Date of Encounter: 01/23/2024  Primary Care Provider:  Camie Patience, FNP Primary Cardiologist:  Tonny Bollman, MD  Frankey Shown has been scheduled for a transition of care follow up appointment with a [facility] provider:  Robin Searing 4/2  Please reach out to Frankey Shown within 48 hours of discharge to confirm appointment and review transition of care protocol questionnaire. Anticipated discharge date: 3/24  Laverda Page, NP  01/23/2024, 12:38 PM

## 2024-01-23 NOTE — Discharge Summary (Addendum)
 Discharge Summary    Patient ID: JERRIC OYEN MRN: [id number]; DOB: February 10, 1956  Admit date: 01/22/2024 Discharge date: 01/23/2024  PCP:  Camie Patience, FNP   [facility] Providers Cardiologist:  Tonny Bollman, MD      Discharge Diagnoses    Principal Problem:   Acute ST elevation myocardial infarction (STEMI) of anterior wall [facility]) Active Problems:   Hyperlipidemia   Ischemic cardiomyopathy  Diagnostic Studies/Procedures    Cath: 01/22/2024    Ramus lesion is 95% stenosed.   Ost LAD lesion is 50% stenosed.   Prox LAD to Mid LAD lesion is 95% stenosed.   Mid LAD lesion is 30% stenosed.   Dist LAD lesion is 80% stenosed.   Prox LAD lesion is 30% stenosed.   Dist LM lesion is 20% stenosed.   Mid RCA lesion is 30% stenosed.   Ost RCA lesion is 20% stenosed.   A drug-eluting stent was successfully placed using a SYNERGY XD 2.75X20.   Post intervention, there is a 0% residual stenosis.   There is moderate to severe left ventricular systolic dysfunction.   LV end diastolic pressure is moderately elevated.   The left ventricular ejection fraction is 25-35% by visual estimate.   In the absence of any other complications or medical issues, we expect the patient to be ready for discharge from an interventional cardiology perspective on 01/24/2024.   Recommend uninterrupted dual antiplatelet therapy with Aspirin 81mg  daily and Prasugrel 10mg  daily for a minimum of 12 months (ACS-Class I recommendation).   1.  Anterior STEMI with inferior changes likely due to wraparound LAD.  The culprit is plaque rupture in the proximal/mid LAD.  In addition, there is severe ostial stenosis in the ramus artery and moderate ostial LAD disease. 2.  Moderately to severely reduced LV systolic function with moderately elevated left ventricular end-diastolic pressure at 24 mmHg. 3.  Successful angioplasty and drug-eluting stent placement to the proximal/mid LAD.   Recommendations: The  ramus is not suitable for PCI due to ostial location and presence of moderate ostial LAD disease.  Recommend treatment of residual coronary artery disease medically.   Diagnostic Dominance: Right  Intervention    Echo: 01/23/2024  IMPRESSIONS     1. Left ventricular ejection fraction, by estimation, is 40 to 45%. The  left ventricle has mildly decreased function. The left ventricle  demonstrates regional wall motion abnormalities (see scoring  diagram/findings for description). Left ventricular  diastolic parameters are consistent with Grade I diastolic dysfunction  (impaired relaxation).   2. Right ventricular systolic function is normal. The right ventricular  size is normal. Tricuspid regurgitation signal is inadequate for assessing  PA pressure.   3. The mitral valve is normal in structure. Trivial mitral valve  regurgitation.   4. The aortic valve is tricuspid. Aortic valve regurgitation is not  visualized. Aortic valve sclerosis is present, with no evidence of aortic  valve stenosis.   5. The inferior vena cava is normal in size with greater than 50%  respiratory variability, suggesting right atrial pressure of 3 mmHg.   FINDINGS   Left Ventricle: Left ventricular ejection fraction, by estimation, is 40  to 45%. The left ventricle has mildly decreased function. The left  ventricle demonstrates regional wall motion abnormalities. The left  ventricular internal cavity size was normal  in size. There is no left ventricular hypertrophy. Left ventricular  diastolic parameters are consistent with Grade I diastolic dysfunction  (impaired relaxation).     LV Wall  Scoring:  The apical inferior segment and apex are akinetic. The mid and distal  anterior septum and apical anterior segment are hypokinetic. The anterior  wall, entire lateral wall, inferior wall, basal anteroseptal segment, mid  inferoseptal segment, and basal inferoseptal segment are normal.   Right Ventricle:  The right ventricular size is normal. No increase in  right ventricular wall thickness. Right ventricular systolic function is  normal. Tricuspid regurgitation signal is inadequate for assessing PA  pressure.   Left Atrium: Left atrial size was normal in size.   Right Atrium: Right atrial size was normal in size.   Pericardium: Trivial pericardial effusion is present.   Mitral Valve: The mitral valve is normal in structure. Trivial mitral  valve regurgitation.   Tricuspid Valve: The tricuspid valve is normal in structure. Tricuspid  valve regurgitation is trivial.   Aortic Valve: The aortic valve is tricuspid. Aortic valve regurgitation is  not visualized. Aortic valve sclerosis is present, with no evidence of  aortic valve stenosis.   Pulmonic Valve: The pulmonic valve was normal in structure. Pulmonic valve  regurgitation is not visualized.   Aorta: The aortic root and ascending aorta are structurally normal, with  no evidence of dilitation.   Venous: The inferior vena cava is normal in size with greater than 50%  respiratory variability, suggesting right atrial pressure of 3 mmHg.   IAS/Shunts: No atrial level shunt detected by color flow Doppler.   _____________   History of Present Illness     GERASIMOS PLOTTS is a 68 y.o. male with past medical history of untreated hypertension and hyperlipidemia.  In addition, he chews tobacco.  He presented with intermittent chest pain and tightness that started the day prior to admission with activities.  The pain was overall mild and he thought that he pulled a muscle.  He went to urgent care the day of admission and his EKG showed borderline ST changes in the inferior leads.  He was discharged and asked to come to the ED for evaluation.  His EKG showed 1 mm of ST elevation starting in V3-V6 as well as the inferior leads.  He was in no distress but was still having chest discomfort and thus a code STEMI was activated.  He was given  aspirin and 4000 units of unfractionated heparin. Taken to the cath lab emergently.    Hospital Course     STEMI -- Underwent cardiac catheterization noted above with successful PCI/DES x1 to wrap around p/mLAD. hsTn peaked 464. Recommendations for DAPT with ASA/Effient for at least one year. Seen by cardiac rehab and ambulated without difficulty.  -- continue ASA, Effient, atorvastatin 80mg  daily  Acute HFmrEF ICM -- LV gram on cath with LVEF of 25-35%. Echo showed LVEF of 40-45%, g1DD, normal RV, trivial MR  -- LVEDP on cath, but no indication for diuresis on physical exam  -- GDMT: continue coreg 3.125mg  BID, losartan 25mg  daily   HLD -- LDL 108 --  started on atorvastatin 80mg  daily -- will need FLP/LFTs in 8 weeks   Patient was seen by Dr. Excell Seltzer and deemed stable for discharge home. Follow up arranged in the office. Medications sent to the [facility] pharmacy.   Did the patient have an acute coronary syndrome (MI, NSTEMI, STEMI, etc) this admission?:  Yes                               AHA/ACC ACS Clinical  Performance & Quality Measures: Aspirin prescribed? - Yes ADP Receptor Inhibitor (Plavix/Clopidogrel, Brilinta/Ticagrelor or Effient/Prasugrel) prescribed (includes medically managed patients)? - Yes Beta Blocker prescribed? - Yes High Intensity Statin (Lipitor 40-80mg  or Crestor 20-40mg ) prescribed? - Yes EF assessed during THIS hospitalization? - Yes For EF <40%, was ACEI/ARB prescribed? - Yes For EF <40%, Aldosterone Antagonist (Spironolactone or Eplerenone) prescribed? - No - Reason:  consider at outpatient follow up Cardiac Rehab Phase II ordered (including medically managed patients)? - Yes       The patient will be scheduled for a TOC follow up appointment in 10-14 days.  A message has been sent to the [facility] and Scheduling Pool at the office where the patient should be seen for follow up.  _____________  Discharge Vitals Blood pressure (!) 128/92, pulse 70,  temperature 98.5 F (36.9 C), temperature source Oral, resp. rate 17, height 5\' 8"  (1.727 m), weight 100 kg, SpO2 97%.  Filed Weights   01/22/24 1300  Weight: 100 kg    Labs & Radiologic Studies    CBC Recent Labs    01/22/24 1606 01/23/24 0335  WBC 11.5* 9.2  HGB 14.5 13.8  HCT 44.0 42.0  MCV 83.8 83.7  PLT 277 259   Basic Metabolic Panel Recent Labs    78/29/56 1322 01/22/24 1606 01/23/24 0335  NA 136  --  137  K 3.8  --  3.7  CL 105  --  107  CO2 24  --  19*  GLUCOSE 102*  --  89  BUN 17  --  15  CREATININE 0.92 0.91 0.95  CALCIUM 9.4  --  8.5*   Liver Function Tests Recent Labs    01/23/24 0335  AST 19  ALT 25  ALKPHOS 55  BILITOT 0.6  PROT 6.4*  ALBUMIN 2.9*   No results for input(s): "LIPASE", "AMYLASE" in the last 72 hours. High Sensitivity Troponin:   Recent Labs  Lab 01/22/24 1322 01/22/24 1606 01/22/24 1809  TROPONINIHS 379* 464* 398*    BNP Invalid input(s): "POCBNP" D-Dimer No results for input(s): "DDIMER" in the last 72 hours. Hemoglobin A1C Recent Labs    01/22/24 1328  HGBA1C 6.2*   Fasting Lipid Panel Recent Labs    01/22/24 1328  CHOL 170  HDL 45  LDLCALC 108*  TRIG 87  CHOLHDL 3.8   Thyroid Function Tests No results for input(s): "TSH", "T4TOTAL", "T3FREE", "THYROIDAB" in the last 72 hours.  Invalid input(s): "FREET3" _____________  ECHOCARDIOGRAM COMPLETE Result Date: 01/23/2024    ECHOCARDIOGRAM REPORT   Patient Name:   RAHIL PASSEY Date of Exam: 01/23/2024 Medical Rec #:  [id number]        Height:       68.0 in Accession #:    [id number]       Weight:       220.5 lb Date of Birth:  Aug 18, 1956        BSA:          2.130 m Patient Age:    68 years         BP:           132/101 mmHg Patient Gender: M                HR:           65 bpm. Exam Location:  Inpatient Procedure: 2D Echo, Color Doppler and Cardiac Doppler (Both Spectral and Color  Flow Doppler were utilized during procedure). Indications:     Acute MI i21.9  History:        Patient has no prior history of Echocardiogram examinations.                 Risk Factors:Dyslipidemia.  Sonographer:    Irving Burton Senior RDCS Referring Phys: Jerolyn Center A ARIDA IMPRESSIONS  1. Left ventricular ejection fraction, by estimation, is 40 to 45%. The left ventricle has mildly decreased function. The left ventricle demonstrates regional wall motion abnormalities (see scoring diagram/findings for description). Left ventricular diastolic parameters are consistent with Grade I diastolic dysfunction (impaired relaxation).  2. Right ventricular systolic function is normal. The right ventricular size is normal. Tricuspid regurgitation signal is inadequate for assessing PA pressure.  3. The mitral valve is normal in structure. Trivial mitral valve regurgitation.  4. The aortic valve is tricuspid. Aortic valve regurgitation is not visualized. Aortic valve sclerosis is present, with no evidence of aortic valve stenosis.  5. The inferior vena cava is normal in size with greater than 50% respiratory variability, suggesting right atrial pressure of 3 mmHg. FINDINGS  Left Ventricle: Left ventricular ejection fraction, by estimation, is 40 to 45%. The left ventricle has mildly decreased function. The left ventricle demonstrates regional wall motion abnormalities. The left ventricular internal cavity size was normal in size. There is no left ventricular hypertrophy. Left ventricular diastolic parameters are consistent with Grade I diastolic dysfunction (impaired relaxation).  LV Wall Scoring: The apical inferior segment and apex are akinetic. The mid and distal anterior septum and apical anterior segment are hypokinetic. The anterior wall, entire lateral wall, inferior wall, basal anteroseptal segment, mid inferoseptal segment, and basal inferoseptal segment are normal. Right Ventricle: The right ventricular size is normal. No increase in right ventricular wall thickness. Right ventricular  systolic function is normal. Tricuspid regurgitation signal is inadequate for assessing PA pressure. Left Atrium: Left atrial size was normal in size. Right Atrium: Right atrial size was normal in size. Pericardium: Trivial pericardial effusion is present. Mitral Valve: The mitral valve is normal in structure. Trivial mitral valve regurgitation. Tricuspid Valve: The tricuspid valve is normal in structure. Tricuspid valve regurgitation is trivial. Aortic Valve: The aortic valve is tricuspid. Aortic valve regurgitation is not visualized. Aortic valve sclerosis is present, with no evidence of aortic valve stenosis. Pulmonic Valve: The pulmonic valve was normal in structure. Pulmonic valve regurgitation is not visualized. Aorta: The aortic root and ascending aorta are structurally normal, with no evidence of dilitation. Venous: The inferior vena cava is normal in size with greater than 50% respiratory variability, suggesting right atrial pressure of 3 mmHg. IAS/Shunts: No atrial level shunt detected by color flow Doppler.  LEFT VENTRICLE PLAX 2D LVIDd:         4.70 cm      Diastology LVIDs:         3.10 cm      LV e' medial:    5.77 cm/s LV PW:         1.00 cm      LV E/e' medial:  10.8 LV IVS:        0.90 cm      LV e' lateral:   9.25 cm/s LVOT diam:     2.20 cm      LV E/e' lateral: 6.7 LV SV:         59 LV SV Index:   28 LVOT Area:     3.80 cm  LV Volumes (MOD)  LV vol d, MOD A2C: 163.0 ml LV vol d, MOD A4C: 146.0 ml LV vol s, MOD A2C: 85.2 ml LV vol s, MOD A4C: 64.5 ml LV SV MOD A2C:     77.8 ml LV SV MOD A4C:     146.0 ml LV SV MOD BP:      83.9 ml RIGHT VENTRICLE RV S prime:     11.60 cm/s TAPSE (M-mode): 2.5 cm LEFT ATRIUM             Index        RIGHT ATRIUM           Index LA diam:        3.70 cm 1.74 cm/m   RA Area:     17.30 cm LA Vol (A2C):   58.6 ml 27.51 ml/m  RA Volume:   49.50 ml  23.24 ml/m LA Vol (A4C):   63.0 ml 29.58 ml/m LA Biplane Vol: 63.7 ml 29.90 ml/m  AORTIC VALVE LVOT Vmax:   72.70 cm/s  LVOT Vmean:  50.900 cm/s LVOT VTI:    0.155 m  AORTA Ao Root diam: 3.70 cm Ao Asc diam:  3.80 cm MITRAL VALVE MV Area (PHT): 2.08 cm    SHUNTS MV Decel Time: 364 msec    Systemic VTI:  0.16 m MV E velocity: 62.40 cm/s  Systemic Diam: 2.20 cm MV A velocity: 44.00 cm/s MV E/A ratio:  1.42 Dalton McleanMD Electronically signed by Wilfred Lacy Signature Date/Time: 01/23/2024/9:52:30 AM    Final    CARDIAC CATHETERIZATION Result Date: 01/22/2024   Ramus lesion is 95% stenosed.   Ost LAD lesion is 50% stenosed.   Prox LAD to Mid LAD lesion is 95% stenosed.   Mid LAD lesion is 30% stenosed.   Dist LAD lesion is 80% stenosed.   Prox LAD lesion is 30% stenosed.   Dist LM lesion is 20% stenosed.   Mid RCA lesion is 30% stenosed.   Ost RCA lesion is 20% stenosed.   A drug-eluting stent was successfully placed using a SYNERGY XD 2.75X20.   Post intervention, there is a 0% residual stenosis.   There is moderate to severe left ventricular systolic dysfunction.   LV end diastolic pressure is moderately elevated.   The left ventricular ejection fraction is 25-35% by visual estimate.   In the absence of any other complications or medical issues, we expect the patient to be ready for discharge from an interventional cardiology perspective on 01/24/2024.   Recommend uninterrupted dual antiplatelet therapy with Aspirin 81mg  daily and Prasugrel 10mg  daily for a minimum of 12 months (ACS-Class I recommendation). 1.  Anterior STEMI with inferior changes likely due to wraparound LAD.  The culprit is plaque rupture in the proximal/mid LAD.  In addition, there is severe ostial stenosis in the ramus artery and moderate ostial LAD disease. 2.  Moderately to severely reduced LV systolic function with moderately elevated left ventricular end-diastolic pressure at 24 mmHg. 3.  Successful angioplasty and drug-eluting stent placement to the proximal/mid LAD. Recommendations: The ramus is not suitable for PCI due to ostial location and presence  of moderate ostial LAD disease.  Recommend treatment of residual coronary artery disease medically.   DG Chest Portable 1 View Result Date: 01/22/2024 CLINICAL DATA:  Chest pain EXAM: PORTABLE CHEST 1 VIEW COMPARISON:  05/14/2014 FINDINGS: AP portable radiograph. Numerous leads and wires project over the chest. Midline trachea. Normal heart size. No pleural effusion or pneumothorax. Clear lungs. IMPRESSION: No active disease.  Electronically Signed   By: Jeronimo Greaves M.D.   On: 01/22/2024 14:23   Disposition   Pt is being discharged home today in good condition.  Follow-up Plans & Appointments     Discharge Instructions     Amb Referral to Cardiac Rehabilitation   Complete by: As directed    Diagnosis:  Coronary Stents STEMI     After initial evaluation and assessments completed: Virtual Based Care may be provided alone or in conjunction with Phase 2 Cardiac Rehab based on patient barriers.: Yes   Intensive Cardiac Rehabilitation (ICR) [facility] location only OR Traditional Cardiac Rehabilitation (TCR) *If criteria for ICR are not met will enroll in TCR [facility] only): Yes   Call MD for:  difficulty breathing, headache or visual disturbances   Complete by: As directed    Call MD for:  persistant dizziness or light-headedness   Complete by: As directed    Call MD for:  redness, tenderness, or signs of infection (pain, swelling, redness, odor or green/yellow discharge around incision site)   Complete by: As directed    Diet - low sodium heart healthy   Complete by: As directed    Discharge instructions   Complete by: As directed    Radial Site Care Refer to this sheet in the next few weeks. These instructions provide you with information on caring for yourself after your procedure. Your caregiver may also give you more specific instructions. Your treatment has been planned according to current medical practices, but problems sometimes occur. Call your caregiver if you have any problems or  questions after your procedure. HOME CARE INSTRUCTIONS You may shower the day after the procedure. Remove the bandage (dressing) and gently wash the site with plain soap and water. Gently pat the site dry.  Do not apply powder or lotion to the site.  Do not submerge the affected site in water for 3 to 5 days.  Inspect the site at least twice daily.  Do not flex or bend the affected arm for 24 hours.  No lifting over 5 pounds (2.3 kg) for 5 days after your procedure.  Do not drive home if you are discharged the same day of the procedure. Have someone else drive you.  You may drive 24 hours after the procedure unless otherwise instructed by your caregiver.  What to expect: Any bruising will usually fade within 1 to 2 weeks.  Blood that collects in the tissue (hematoma) may be painful to the touch. It should usually decrease in size and tenderness within 1 to 2 weeks.  SEEK IMMEDIATE MEDICAL CARE IF: You have unusual pain at the radial site.  You have redness, warmth, swelling, or pain at the radial site.  You have drainage (other than a small amount of blood on the dressing).  You have chills.  You have a fever or persistent symptoms for more than 72 hours.  You have a fever and your symptoms suddenly get worse.  Your arm becomes pale, cool, tingly, or numb.  You have heavy bleeding from the site. Hold pressure on the site.   PLEASE DO NOT MISS ANY DOSES OF YOUR EFFIENT!!!!! Also keep a log of you blood pressures and bring back to your follow up appt. Please call the office with any questions.   Patients taking blood thinners should generally stay away from medicines like ibuprofen, Advil, Motrin, naproxen, and Aleve due to risk of stomach bleeding. You may take Tylenol as directed or talk to your primary doctor  about alternatives.  PLEASE ENSURE THAT YOU DO NOT RUN OUT OF YOUR EFFIENT. This medication is very important to remain on for at least one year. IF you have issues obtaining this  medication due to cost please CALL the office 3-5 business days prior to running out in order to prevent missing doses of this medication.   Increase activity slowly   Complete by: As directed         Discharge Medications   Allergies as of 01/23/2024   No Known Allergies      Medication List     TAKE these medications    aspirin 81 MG chewable tablet Chew 1 tablet (81 mg total) by mouth daily. Start taking on: January 24, 2024   atorvastatin 80 MG tablet Commonly known as: LIPITOR Take 1 tablet (80 mg total) by mouth daily. Start taking on: January 24, 2024   carvedilol 3.125 MG tablet Commonly known as: COREG Take 1 tablet (3.125 mg total) by mouth 2 (two) times daily with a meal.   losartan 25 MG tablet Commonly known as: COZAAR Take 1 tablet (25 mg total) by mouth daily. Start taking on: January 24, 2024   nitroGLYCERIN 0.4 MG SL tablet Commonly known as: Nitrostat Place 1 tablet (0.4 mg total) under the tongue every 5 (five) minutes as needed.   prasugrel 10 MG Tabs tablet Commonly known as: EFFIENT Take 1 tablet (10 mg total) by mouth daily. Start taking on: January 24, 2024        Outstanding Labs/Studies   FLP/LFTs in 8 weeks  Duration of Discharge Encounter: APP Time: 20 minutes   Signed, Laverda Page, NP 01/23/2024, 12:35 PM  Patient seen, examined. Available data reviewed. Agree with findings, assessment, and plan as outlined by Laverda Page, NP.  Please see my rounding note the same date.  Physical exam documented in my other note.  The patient has had an uncomplicated STEMI involving the LAD and he was treated with PCI without problems.  He is on aspirin and prasugrel for DAPT.  2D echo is reviewed and shows an LVEF of 40 to 45%.  He has been started on losartan and carvedilol.  The patient exhibits no signs of volume overload and does not require loop diuretic therapy.  He has been started on a high intensity statin drug for treatment of his  mixed hyperlipidemia.  Reviewed notes of cardiac rehab.  Patient did well with ambulation with no associated symptoms.  Post MI prognosis, importance to medication adherence, importance of tobacco cessation, and recommendations for phase 2 cardiac rehab are all reviewed with the patient today.  MD time spent on patient discharge is 25 minutes which includes review of cardiac catheterization films, review of labs and vital signs, personal exam of the patient, and counseling regarding medications, post MI restrictions, and lifestyle modification as above.  Patient is medically stable for discharge.  Tonny Bollman, M.D. 01/23/2024 1:20 PM

## 2024-01-23 NOTE — Progress Notes (Signed)
 Echocardiogram 2D Echocardiogram has been performed.  Warren Lacy Aldrich Lloyd RDCS 01/23/2024, 9:50 AM

## 2024-01-23 NOTE — Progress Notes (Signed)
   Patient Name: Todd Lynn Date of Encounter: 01/23/2024 [facility] Cardiologist: None   Interval Summary  .    Feeling well this morning.  No chest pain or shortness of breath.  Describes his chest discomfort symptoms yesterday as "mild."  Vital Signs .    Vitals:   01/23/24 0300 01/23/24 0400 01/23/24 0500 01/23/24 0600  BP: (!) 120/91 116/78 109/70 113/74  Pulse: 61 (!) 59 (!) 59 (!) 58  Resp: 13 12 15 14   Temp: 98.6 F (37 C)     TempSrc: Oral     SpO2: 98% 96% 95% 95%  Weight:      Height:        Intake/Output Summary (Last 24 hours) at 01/23/2024 0612 Last data filed at 01/23/2024 0600 Gross per 24 hour  Intake 126.86 ml  Output 1225 ml  Net -1098.14 ml      01/22/2024    1:00 PM 08/25/2020   11:17 AM 10/05/2018    2:49 PM  Last 3 Weights  Weight (lbs) 220 lb 7.4 oz 215 lb 214 lb  Weight (kg) 100 kg 97.523 kg 97.07 kg      Telemetry/ECG    Sinus rhythm with no significant arrhythmia - Personally Reviewed  Physical Exam .   GEN: No acute distress.   Neck: No JVD Cardiac: RRR, no murmurs, rubs, or gallops.  Respiratory: Clear to auscultation bilaterally. GI: Soft, nontender, non-distended  MS: No edema, right radial cath site clear  Assessment & Plan .     STEMI involving the LAD: cath films reviewed. S/P PCI of the LAD and med Rx recommended for management of residual CAD involving the ostial LAD and ostial ramus branch. ASA/prasugrel for DAPT. Interesting that troponin is only mildly elevated.  Acute HFrEF: echo ordered. LVEF severely reduced by assessment with ventriculography. Pt started on low dose losartan and carvedilol. LVEDP 24 mmHg at cath but no indication for diuresis at present.  Mixed hyperlipidemia: LDL 108. Started on high-intensity statin Rx.  Dispo: Cardiac rehab Phase 1, await 2D echo, may consider hospital discharge pending echo result.  Further titration of GDMT pending an LVEF on echo.   For questions or updates,  please contact [facility] Please consult [email or web address] for contact info under        Signed, Tonny Bollman, MD

## 2024-01-23 NOTE — Telephone Encounter (Signed)
 Patient Product/process development scientist completed.    The patient is insured through [facility]. Patient has Medicare and is not eligible for a copay card, but may be able to apply for patient assistance or Medicare RX Payment Plan (Patient Must reach out to their plan, if eligible for payment plan), if available.    Ran test claim for prasugrel (Effient) 10 mg and the current 30 day co-pay is $36.22.   This test claim was processed through [facility]- copay amounts may vary at other pharmacies due to pharmacy/plan contracts, or as the patient moves through the different stages of their insurance plan.     Roland Earl, CPHT Pharmacy Technician III Certified Patient Advocate [facility] Patient Advocate Team Direct Number: [phone]  Fax: [phone]

## 2024-01-24 LAB — LIPOPROTEIN A (LPA): Lipoprotein (a): 224.8 nmol/L — ABNORMAL HIGH (ref <75.0)

## 2024-01-24 NOTE — Telephone Encounter (Addendum)
 TOC call #1.... left a message for the pt to call back.   From D/C summary: - continue ASA, Effient, atorvastatin 80mg  daily

## 2024-01-25 NOTE — Telephone Encounter (Signed)
 Patient contacted regarding discharge from [facility] on 01/23/24.  Patient understands to follow up with provider Robin Searing NP on 02/01/24 at 2:45 pm at [facility] location. Patient understands discharge instructions? yes Patient understands medications and regiment? yes Patient understands to bring all medications to this visit? yes

## 2024-01-26 DIAGNOSIS — I5021 Acute systolic (congestive) heart failure: Secondary | ICD-10-CM | POA: Diagnosis not present

## 2024-01-26 DIAGNOSIS — I213 ST elevation (STEMI) myocardial infarction of unspecified site: Secondary | ICD-10-CM | POA: Diagnosis not present

## 2024-01-26 DIAGNOSIS — L282 Other prurigo: Secondary | ICD-10-CM | POA: Diagnosis not present

## 2024-01-26 DIAGNOSIS — M25511 Pain in right shoulder: Secondary | ICD-10-CM | POA: Diagnosis not present

## 2024-01-31 NOTE — Progress Notes (Unsigned)
 Cardiology Office Note    Patient Name: Todd Lynn Date of Encounter: 02/01/2024  Primary Care Provider:  Camie Patience, FNP Primary Cardiologist:  Tonny Bollman, MD Primary Electrophysiologist: None   Past Medical History    Past Medical History:  Diagnosis Date   Hyperlipidemia     History of Present Illness  Todd Lynn is a 68 y.o. male with a PMH of CAD s/p anterior STEMI with PCI/DES to proximal/mid LAD, HLD, HTN, HFmrEF,ICM who presents today for post PCI follow-up.  Todd Lynn presented to the ED on 01/22/2024 for evaluation of chest pain and was found to have anterior ST elevated MI.  He reported the discomfort as tightness that was waxing and waning.  He was given an ASA and 4000 units of heparin and underwent LHC emergently.  Cath showed 95% stenosed ramus, 80% distal LAD, 50% ostial LAD with PCI to the proximal-mid LAD and ramus not suitable for PCI due to location.  2D echo was completed showing reduced EF of 40-45% with RWMA and grade 1 DD with trivial MVR.  He was started on ASA and Effient for 12 months as well as atorvastatin 80 mg.  GDMT was titrated for decreased LV function with losartan 25 mg and carvedilol 3.125 mg.  Todd Lynn presents today for post PCI follow-up. Since the procedure, he has had no new chest pain or discomfort. Prior to the event, he experienced pressure and dyspnea after physical exertion, such as walking and climbing stairs, but did not have a distinct event indicating a myocardial infarction. He recalls a similar sensation two weeks prior, initially attributed to indigestion. Post-procedure, he reports improved breathing and reduced fatigue. He has not resumed his full workout routine but continues to walk regularly, covering distances of up to three miles without issues.  Patient's blood pressure was elevated today at 138/90 and was 132/94 on recheck. He has a history of elevated blood pressure readings but was not previously on  medication. He is now on carvedilol and Lipitor for blood pressure and cholesterol management. He reports a slight epistaxis, which he attributes to dryness, but no significant bleeding or bruising issues with Prasugrel. He follows a diet primarily consisting of chicken breast, jalapenos, fruit, and minimal starches and saturated fats. He is focused on losing weight slowly and maintaining a healthy lifestyle. He has a history of smoking cigars, which he has quit since his cardiac event. He has a family history of diabetes, with his mother having died from the condition, but he has not been diagnosed with diabetes himself. Patient denies chest pain, palpitations, dyspnea, PND, orthopnea, nausea, vomiting, dizziness, syncope, edema, weight gain, or early satiety.  Discussed the use of AI scribe software for clinical note transcription with the patient, who gave verbal consent to proceed.  History of Present Illness   Review of Systems  Please see the history of present illness.    All other systems reviewed and are otherwise negative except as noted above.  Physical Exam    Wt Readings from Last 3 Encounters:  02/01/24 225 lb (102.1 kg)  01/22/24 220 lb 7.4 oz (100 kg)  08/25/20 215 lb (97.5 kg)   VS: Vitals:   02/01/24 1452 02/01/24 1517  BP: (!) 138/90 (!) 132/94  Pulse: (!) 58   SpO2: 96%   ,Body mass index is 34.21 kg/m. GEN: Well nourished, well developed in no acute distress Neck: No JVD; No carotid bruits Pulmonary: Clear to auscultation without rales, wheezing or  rhonchi  Cardiovascular: Normal rate. Regular rhythm. Normal S1. Normal S2.  Right radial cath site clean dry and intact with no evidence of ecchymosis or hematomaMurmurs: There is no murmur.  ABDOMEN: Soft, non-tender, non-distended EXTREMITIES:  No edema; No deformity   EKG/LABS/ Recent Cardiac Studies   ECG personally reviewed by me today -sinus bradycardia with rate of 59 bpm and TWI in precordial leads consistent  with previous EKG and not acute change  Risk Assessment/Calculations:     Lab Results  Component Value Date   WBC 9.2 01/23/2024   HGB 13.8 01/23/2024   HCT 42.0 01/23/2024   MCV 83.7 01/23/2024   PLT 259 01/23/2024   Lab Results  Component Value Date   CREATININE 0.95 01/23/2024   BUN 15 01/23/2024   NA 137 01/23/2024   K 3.7 01/23/2024   CL 107 01/23/2024   CO2 19 (L) 01/23/2024   Lab Results  Component Value Date   CHOL 170 01/22/2024   HDL 45 01/22/2024   LDLCALC 108 (H) 01/22/2024   TRIG 87 01/22/2024   CHOLHDL 3.8 01/22/2024    Lab Results  Component Value Date   HGBA1C 6.2 (H) 01/22/2024   Assessment & Plan    1.  Coronary artery disease: -s/p anterior STEMI with PCI/DES to proximal/mid LAD -Today patient reports no chest pain and improvement to breathing and stamina -He is cleared to proceed with cardiac rehab at this time -Continue current GDMT with Effient 10 mg daily, aspirin 81 mg, Lipitor 80 mg, Nitrostat 0.4 mg and carvedilol 6.25 mg twice daily  2.  HFmrEF: -2D echo with mildly reduced EF of 40-45% following recent MI -Today patient is euvolemic on exam with no complaints of shortness of breath or lower extremity swelling -Preauthorization done for [employer] however was cost prohibitive -Add spironolactone 12.5 mg daily -BMET in 2 weeks -Continue carvedilol 3.125 mg twice daily, losartan 25 mg daily -We will plan to recheck 2D echo in 3 months  3.  Hyperlipidemia: -Patient's last LDL cholesterol was 108 -We will check LFT and lipids in 8 weeks -Continue Lipitor 80 mg daily -Patient LP(a) elevated and will refer to lipid clinic  4.  HTN: -HYPERTENSION CONTROL Vitals:   02/01/24 1452 02/01/24 1517  BP: (!) 138/90 (!) 132/94    The patient's blood pressure is elevated above target today.  In order to address the patient's elevated BP: A current anti-hypertensive medication was adjusted today.; A new medication was prescribed today.; Follow  up with general cardiology has been recommended.     - Increase carvedilol dose to 6.25 mg twice daily. - Monitor blood pressure at home with a prescribed blood pressure cuff. -Patient started on spironolactone 12.5 mg added    Cardiac Rehabilitation Eligibility Assessment  The patient is ready to start cardiac rehabilitation from a cardiac standpoint.     Disposition: Follow-up with Tonny Bollman, MD or APP in 3 months    Signed, Napoleon Form, Leodis Rains, NP 02/01/2024, 5:49 PM [facility]

## 2024-02-01 ENCOUNTER — Encounter: Payer: Self-pay | Admitting: Nurse Practitioner

## 2024-02-01 ENCOUNTER — Other Ambulatory Visit: Payer: Self-pay

## 2024-02-01 ENCOUNTER — Ambulatory Visit: Attending: Nurse Practitioner | Admitting: Nurse Practitioner

## 2024-02-01 VITALS — BP 132/94 | HR 58 | Ht 68.0 in | Wt 225.0 lb

## 2024-02-01 DIAGNOSIS — I5022 Chronic systolic (congestive) heart failure: Secondary | ICD-10-CM

## 2024-02-01 DIAGNOSIS — I10 Essential (primary) hypertension: Secondary | ICD-10-CM

## 2024-02-01 DIAGNOSIS — I2510 Atherosclerotic heart disease of native coronary artery without angina pectoris: Secondary | ICD-10-CM

## 2024-02-01 DIAGNOSIS — I2109 ST elevation (STEMI) myocardial infarction involving other coronary artery of anterior wall: Secondary | ICD-10-CM

## 2024-02-01 DIAGNOSIS — E785 Hyperlipidemia, unspecified: Secondary | ICD-10-CM | POA: Diagnosis not present

## 2024-02-01 MED ORDER — BLOOD PRESSURE CUFF MISC: 1.0000 | Freq: Every day | 0 refills | Status: DC

## 2024-02-01 MED ORDER — SPIRONOLACTONE 25 MG PO TABS: 25.0000 mg | ORAL_TABLET | Freq: Every day | ORAL | 1 refills | Status: DC

## 2024-02-01 MED ORDER — CARVEDILOL 6.25 MG PO TABS: 6.2500 mg | ORAL_TABLET | Freq: Two times a day (BID) | ORAL | 6 refills | Status: DC

## 2024-02-01 MED ORDER — SPIRONOLACTONE 25 MG PO TABS
12.5000 mg | ORAL_TABLET | Freq: Every day | ORAL | 1 refills | Status: DC
Start: 2024-02-01 — End: 2024-08-08

## 2024-02-01 NOTE — Patient Instructions (Addendum)
 Medication Instructions:  INCREASE Coreg to 6.25mg  Take 1 tablet twice a day  START Spironolactone 12.5mg  Take 1 tablet once a day *If you need a refill on your cardiac medications before your next appointment, please call your pharmacy*  Lab Work: None ordered If you have labs (blood work) drawn today and your tests are completely normal, you will receive your results only by: MyChart Message (if you have MyChart) OR A paper copy in the mail If you have any lab test that is abnormal or we need to change your treatment, we will call you to review the results.  Testing/Procedures: None ordered  Follow-Up: At [facility], you and your health needs are our priority.  As part of our continuing mission to provide you with exceptional heart care, our providers are all part of one team.  This team includes your primary Cardiologist (physician) and Advanced Practice Providers or APPs (Physician Assistants and Nurse Practitioners) who all work together to provide you with the care you need, when you need it.  Your next appointment:   3 month(s)  Provider:   Tonny Bollman, MD  or Robin Searing, NP   We recommend signing up for the patient portal called "MyChart".  Sign up information is provided on this After Visit Summary.  MyChart is used to connect with patients for Virtual Visits (Telemedicine).  Patients are able to view lab/test results, encounter notes, upcoming appointments, etc.  Non-urgent messages can be sent to your provider as well.   To learn more about what you can do with MyChart, go to [email or web address].   Other Instructions YOUR BLOOD PRESSURE GOAL IS 130/80      1st Floor: - Lobby - Registration  - Pharmacy  - Lab - Cafe  2nd Floor: - PV Lab - Diagnostic Testing (echo, CT, nuclear med)  3rd Floor: - Vacant  4th Floor: - TCTS (cardiothoracic surgery) - AFib Clinic - Structural Heart Clinic - Vascular Surgery  - Vascular Ultrasound  5th  Floor: - HeartCare Cardiology (general and EP) - Clinical Pharmacy for coumadin, hypertension, lipid, weight-loss medications, and med management appointments    Valet parking services will be available as well.

## 2024-02-02 ENCOUNTER — Other Ambulatory Visit: Payer: Self-pay

## 2024-02-02 ENCOUNTER — Telehealth: Payer: Self-pay

## 2024-02-02 ENCOUNTER — Telehealth (HOSPITAL_COMMUNITY): Payer: Self-pay

## 2024-02-02 DIAGNOSIS — E785 Hyperlipidemia, unspecified: Secondary | ICD-10-CM

## 2024-02-02 NOTE — Telephone Encounter (Signed)
 Called and spoke with pt in regards to CR, pt stated he is not interested at this time.   Closed referral

## 2024-02-02 NOTE — Telephone Encounter (Signed)
-----   Message from Napoleon Form, Leodis Rains sent at 02/01/2024  5:50 PM EDT ----- Please add referral for lipid clinic to discuss possible PCSK9 due to elevated LP(a).  Please also add LFT and lipid check in 8 weeks.  Thanks, Alden Server

## 2024-02-02 NOTE — Telephone Encounter (Addendum)
 Patient notified directly and voiced understanding.   Referral has been placed and labs orders placed.  Per patients request labs req have been printed and mailed to him

## 2024-02-15 DIAGNOSIS — E785 Hyperlipidemia, unspecified: Secondary | ICD-10-CM | POA: Diagnosis not present

## 2024-02-15 LAB — HEPATIC FUNCTION PANEL
ALT: 22 IU/L (ref 0–44)
AST: 18 IU/L (ref 0–40)
Albumin: 3.9 g/dL (ref 3.9–4.9)
Alkaline Phosphatase: 106 IU/L (ref 44–121)
Bilirubin Total: 0.5 mg/dL (ref 0.0–1.2)
Bilirubin, Direct: 0.18 mg/dL (ref 0.00–0.40)
Total Protein: 7 g/dL (ref 6.0–8.5)

## 2024-02-15 LAB — LIPID PANEL
Chol/HDL Ratio: 2.7 ratio (ref 0.0–5.0)
Cholesterol, Total: 119 mg/dL (ref 100–199)
HDL: 44 mg/dL (ref >39)
LDL Chol Calc (NIH): 60 mg/dL (ref 0–99)
Triglycerides: 75 mg/dL (ref 0–149)
VLDL Cholesterol Cal: 15 mg/dL (ref 5–40)

## 2024-02-23 ENCOUNTER — Other Ambulatory Visit (HOSPITAL_COMMUNITY): Payer: Self-pay

## 2024-02-25 ENCOUNTER — Encounter (HOSPITAL_COMMUNITY): Payer: Self-pay | Admitting: *Deleted

## 2024-02-25 ENCOUNTER — Other Ambulatory Visit: Payer: Self-pay

## 2024-02-25 ENCOUNTER — Emergency Department (HOSPITAL_COMMUNITY)

## 2024-02-25 ENCOUNTER — Emergency Department (HOSPITAL_COMMUNITY)
Admission: EM | Admit: 2024-02-25 | Discharge: 2024-02-25 | Disposition: A | Discharge status: Home / Self Care | Source: Home / Self Care | Attending: Emergency Medicine | Admitting: Emergency Medicine

## 2024-02-25 ENCOUNTER — Emergency Department (HOSPITAL_COMMUNITY)
Admission: EM | Admit: 2024-02-25 | Discharge: 2024-02-25 | Disposition: A | Discharge status: Home / Self Care | Attending: Emergency Medicine | Admitting: Emergency Medicine

## 2024-02-25 DIAGNOSIS — Z7982 Long term (current) use of aspirin: Secondary | ICD-10-CM | POA: Insufficient documentation

## 2024-02-25 DIAGNOSIS — L7622 Postprocedural hemorrhage and hematoma of skin and subcutaneous tissue following other procedure: Secondary | ICD-10-CM | POA: Insufficient documentation

## 2024-02-25 DIAGNOSIS — S51811A Laceration without foreign body of right forearm, initial encounter: Secondary | ICD-10-CM | POA: Diagnosis not present

## 2024-02-25 DIAGNOSIS — W25XXXA Contact with sharp glass, initial encounter: Secondary | ICD-10-CM | POA: Diagnosis not present

## 2024-02-25 DIAGNOSIS — Z23 Encounter for immunization: Secondary | ICD-10-CM | POA: Insufficient documentation

## 2024-02-25 DIAGNOSIS — S56922A Laceration of unspecified muscles, fascia and tendons at forearm level, left arm, initial encounter: Secondary | ICD-10-CM | POA: Diagnosis not present

## 2024-02-25 DIAGNOSIS — Y990 Civilian activity done for income or pay: Secondary | ICD-10-CM | POA: Insufficient documentation

## 2024-02-25 DIAGNOSIS — S51812A Laceration without foreign body of left forearm, initial encounter: Secondary | ICD-10-CM | POA: Insufficient documentation

## 2024-02-25 DIAGNOSIS — T148XXA Other injury of unspecified body region, initial encounter: Secondary | ICD-10-CM

## 2024-02-25 DIAGNOSIS — S41112A Laceration without foreign body of left upper arm, initial encounter: Secondary | ICD-10-CM

## 2024-02-25 MED ORDER — TETANUS-DIPHTH-ACELL PERTUSSIS 5-2.5-18.5 LF-MCG/0.5 IM SUSY
0.5000 mL | PREFILLED_SYRINGE | Freq: Once | INTRAMUSCULAR | Status: AC
Start: 2024-02-25 — End: 2024-02-25
  Administered 2024-02-25: 0.5 mL via INTRAMUSCULAR
  Filled 2024-02-25: qty 0.5

## 2024-02-25 MED ORDER — LIDOCAINE-EPINEPHRINE (PF) 2 %-1:200000 IJ SOLN
20.0000 mL | Freq: Once | INTRAMUSCULAR | Status: AC
Start: 2024-02-25 — End: 2024-02-25
  Administered 2024-02-25: 20 mL via INTRADERMAL
  Filled 2024-02-25: qty 20
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
## 2024-02-25 NOTE — ED Provider Notes (Signed)
 Physical Exam  BP (!) 142/102 (BP Location: Right Arm)   Pulse 62   Temp 98.1 F (36.7 C)   Resp 18   Ht 5\' 8"  (1.727 m)   Wt 102.1 kg   SpO2 99%   BMI 34.22 kg/m   Physical Exam  Procedures  .Laceration Repair  Date/Time: 02/25/2024 4:19 AM  Performed by: Kae Oram, PA-C Authorized by: Kae Oram, PA-C   Consent:    Consent obtained:  Verbal   Consent given by:  Patient   Risks discussed:  Infection, nerve damage, poor wound healing, poor cosmetic result, pain and retained foreign body   Alternatives discussed:  No treatment Universal protocol:    Patient identity confirmed:  Arm band and verbally with patient Anesthesia:    Anesthesia method:  Local infiltration   Local anesthetic:  Lidocaine  2% WITH epi Laceration details:    Location:  Shoulder/arm   Shoulder/arm location:  L lower arm   Length (cm):  2 (V shaped) Pre-procedure details:    Preparation:  Patient was prepped and draped in usual sterile fashion and imaging obtained to evaluate for foreign bodies Exploration:    Limited defect created (wound extended): no     Imaging obtained: x-ray     Imaging outcome: foreign body not noted     Wound exploration: wound explored through full range of motion and entire depth of wound visualized     Wound extent: no foreign body, no signs of injury, no underlying fracture and no vascular damage     Contaminated: no   Treatment:    Area cleansed with:  Saline   Amount of cleaning:  Standard   Irrigation solution:  Sterile saline Skin repair:    Repair method:  Sutures   Suture size:  4-0   Suture material:  Prolene   Suture technique:  Simple interrupted   Number of sutures:  3 Approximation:    Approximation:  Close Repair type:    Repair type:  Simple Post-procedure details:    Dressing:  Antibiotic ointment   Procedure completion:  Tolerated well, no immediate complications .Laceration Repair  Date/Time: 02/25/2024 4:20  AM  Performed by: Kae Oram, PA-C Authorized by: Kae Oram, PA-C   Consent:    Consent obtained:  Verbal   Risks discussed:  Infection   Alternatives discussed:  No treatment and delayed treatment Universal protocol:    Patient identity confirmed:  Arm band Anesthesia:    Anesthesia method:  None Laceration details:    Location:  Shoulder/arm   Shoulder/arm location:  L lower arm   Length (cm):  1 Pre-procedure details:    Preparation:  Imaging obtained to evaluate for foreign bodies Exploration:    Limited defect created (wound extended): no     Imaging obtained: x-ray     Imaging outcome: foreign body not noted     Contaminated: no   Treatment:    Area cleansed with:  Saline   Amount of cleaning:  Standard   Irrigation solution:  Sterile saline Skin repair:    Repair method:  Steri-Strips   Number of Steri-Strips:  1 Approximation:    Approximation:  Close Repair type:    Repair type:  Simple Post-procedure details:    Dressing:  Open (no dressing)   ED Course / MDM    Medical Decision Making Amount and/or Complexity of Data Reviewed Radiology: ordered.  Risk Prescription drug management.    I assisted in the care of  this patient before laceration repair only.  Please see associated note by primary EDP Dr. Morris Arch for further insight into the patient's ED course.  This chart was dictated using voice recognition software, Dragon. Despite the best efforts of this provider to proofread and correct errors, errors may still occur which can change documentation meaning.       Kae Oram, PA-C 02/25/24 0421    Lindle Rhea, MD 02/25/24 [phone]
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
## 2024-02-25 NOTE — ED Triage Notes (Signed)
 Pt arrives ambulatory POV c/o stitches reopened around 1215/1230 today, pt had stitches placed this morning. Bleeding controlled by coban placed by front staff. Pt is on blood thinner. Pt denies any pain.

## 2024-02-25 NOTE — Discharge Instructions (Addendum)
 It was a pleasure taking care of you today.  You are seen because of bleeding from your sutures.  Your sutures are still intact but we used pressure and Quick Clot gauze to stop the bleeding.  Leave the dressing on until tomorrow.  Not to bump or disturb the area.  If you have bleeding again you can apply pressure for 10 minutes continuously.  If it is still bleeding or starts bleeding heavily you should come back to the ER right away.  Otherwise follow-up as instructed for suture removal after last night's visit.

## 2024-03-13 DIAGNOSIS — I10 Essential (primary) hypertension: Secondary | ICD-10-CM | POA: Diagnosis not present

## 2024-03-13 DIAGNOSIS — Z4802 Encounter for removal of sutures: Secondary | ICD-10-CM | POA: Diagnosis not present

## 2024-03-20 DIAGNOSIS — Z955 Presence of coronary angioplasty implant and graft: Secondary | ICD-10-CM | POA: Diagnosis not present

## 2024-03-20 DIAGNOSIS — M25511 Pain in right shoulder: Secondary | ICD-10-CM | POA: Diagnosis not present

## 2024-03-20 DIAGNOSIS — G8929 Other chronic pain: Secondary | ICD-10-CM | POA: Diagnosis not present

## 2024-03-23 ENCOUNTER — Other Ambulatory Visit (HOSPITAL_COMMUNITY): Payer: Self-pay

## 2024-04-04 ENCOUNTER — Ambulatory Visit: Attending: Cardiology | Admitting: Pharmacist

## 2024-04-04 ENCOUNTER — Encounter: Payer: Self-pay | Admitting: Pharmacist

## 2024-04-04 VITALS — BP 131/87 | HR 60

## 2024-04-04 DIAGNOSIS — E7849 Other hyperlipidemia: Secondary | ICD-10-CM | POA: Diagnosis not present

## 2024-04-04 NOTE — Progress Notes (Signed)
 Patient ID: Todd Lynn                 DOB: 1956/06/06                    MRN: [id number]      HPI: Todd Lynn is a 68 y.o. male patient referred to lipid clinic by Charles Connor, NP. 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 8 weeks post Lipitor shows LDL level of 60 mg/dl. Patient presented today for lipid clinic reports he is abel to tolerate Lipitor without any side effects. He takes all his meds as prescribed and tolerates them well. Denies any SOB, chest pain, palpitation, dizziness, headaches or swelling   Current Medications: Lipitor 80 mg daily  Intolerances: none  Risk Factors: CAD s/p anterior STEMI with PCI/DES to proximal/mid LAD(01/22/24), HLD, HTN, LDL goal: <70 mg/dl  Last lab: 93/23/5573 LDL 60, TC 119, TG 75, HDL 44  Diet:The patient follows a low sodium and low fat diet, including eggs and avocado regularly. He eats fish and chicken frequently, with red meat usually limited to once a week. His diet also includes black beans and a variety of vegetables. For snacks, he chooses whole grain crackers and enjoys cookies in moderation.  Exercise: 2-3 miles per day and does resistance exercise 3 times per week   Family History:  Mother- strokes - in late 35's   Father - does not know  Siblings - hypertension, DM   Social History:  Alcohol: 2-3 drink per day  Smoking: quit few months ago  Labs:  Lipid Panel     Component Value Date/Time   CHOL 119 02/15/2024 1057   TRIG 75 02/15/2024 1057   HDL 44 02/15/2024 1057   CHOLHDL 2.7 02/15/2024 1057   CHOLHDL 3.8 01/22/2024 1328   VLDL 17 01/22/2024 1328   LDLCALC 60 02/15/2024 1057   LABVLDL 15 02/15/2024 1057    Past Medical History:  Diagnosis Date   Hyperlipidemia     Current Outpatient Medications on File Prior to Visit   Medication Sig Dispense Refill   aspirin  81 MG chewable tablet Chew 1 tablet (81 mg total) by mouth daily. 90 tablet 2   atorvastatin  (LIPITOR) 80 MG tablet Take 1 tablet (80 mg total) by mouth daily. 90 tablet 1   Blood Pressure Monitoring (BLOOD PRESSURE CUFF) MISC 1 each by Does not apply route daily. 1 each 0   carvedilol  (COREG ) 6.25 MG tablet Take 1 tablet (6.25 mg total) by mouth 2 (two) times daily with a meal. 60 tablet 6   losartan  (COZAAR ) 25 MG tablet Take 1 tablet (25 mg total) by mouth daily. 90 tablet 0   nitroGLYCERIN  (NITROSTAT ) 0.4 MG SL tablet Place 1 tablet (0.4 mg total) under the tongue every 5 (five) minutes as needed. 25 tablet 2   prasugrel  (EFFIENT ) 10 MG TABS tablet Take 1 tablet (10 mg total) by mouth daily. 90 tablet 2   spironolactone  (ALDACTONE ) 25 MG tablet Take 0.5 tablets (12.5 mg total) by mouth daily. 90 tablet 1   No current facility-administered medications on file prior to visit.    No Known Allergies  Assessment/Plan:  1. 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 Lipoprotein(a) (Lp(a)) remains elevated. Current therapy with Lipitor 80 mg daily is effectively maintaining LDL below the goal of 70 mg/dL, and the patient tolerates the medication well without side effects. Family history is significant for the patient's mother having had a stroke in her late 7s. Given that LDL is at goal, no changes will be made to the current therapy at this time. However, if future labs indicate changes, consideration may be given to adding a PCSK9 inhibitor due to the elevated Lp(a) level    Thank you,  [name],  Pharm.D [place] [facility]. [facility] [address], [place], [place] [address] Phone: [phone]; Fax: [phone]

## 2024-04-04 NOTE — Assessment & Plan Note (Signed)
 Assessment and plan:   The patient's most recent LDL cholesterol was 60 mg/dL in April 6213. Lipoprotein(a) (Lp(a)) remains elevated. Current therapy with Lipitor 80 mg daily is effectively maintaining LDL below the goal of 70 mg/dL, and the patient tolerates the medication well without side effects. Family history is significant for the patient's mother having had a stroke in her late 19s. Given that LDL is at goal, no changes will be made to the current therapy at this time. However, if future labs indicate changes, consideration may be given to adding a PCSK9 inhibitor due to the elevated Lp(a) level

## 2024-04-10 DIAGNOSIS — Z0000 Encounter for general adult medical examination without abnormal findings: Secondary | ICD-10-CM | POA: Diagnosis not present

## 2024-04-10 DIAGNOSIS — I10 Essential (primary) hypertension: Secondary | ICD-10-CM | POA: Diagnosis not present

## 2024-04-10 DIAGNOSIS — E782 Mixed hyperlipidemia: Secondary | ICD-10-CM | POA: Diagnosis not present

## 2024-04-10 DIAGNOSIS — L237 Allergic contact dermatitis due to plants, except food: Secondary | ICD-10-CM | POA: Diagnosis not present

## 2024-04-10 DIAGNOSIS — Z23 Encounter for immunization: Secondary | ICD-10-CM | POA: Diagnosis not present

## 2024-04-10 DIAGNOSIS — E7841 Elevated Lipoprotein(a): Secondary | ICD-10-CM | POA: Diagnosis not present

## 2024-04-10 DIAGNOSIS — I252 Old myocardial infarction: Secondary | ICD-10-CM | POA: Diagnosis not present

## 2024-04-19 ENCOUNTER — Other Ambulatory Visit: Payer: Self-pay | Admitting: Cardiology

## 2024-04-19 ENCOUNTER — Other Ambulatory Visit (HOSPITAL_COMMUNITY): Payer: Self-pay

## 2024-04-20 ENCOUNTER — Other Ambulatory Visit (HOSPITAL_COMMUNITY): Payer: Self-pay

## 2024-04-20 MED ORDER — LOSARTAN POTASSIUM 25 MG PO TABS
25.0000 mg | ORAL_TABLET | Freq: Every day | ORAL | 0 refills | Status: DC
  Filled 2024-04-20: qty 90, 90d supply, fill #0

## 2024-04-30 DIAGNOSIS — I213 ST elevation (STEMI) myocardial infarction of unspecified site: Secondary | ICD-10-CM | POA: Diagnosis not present

## 2024-04-30 DIAGNOSIS — I5021 Acute systolic (congestive) heart failure: Secondary | ICD-10-CM | POA: Diagnosis not present

## 2024-04-30 DIAGNOSIS — E782 Mixed hyperlipidemia: Secondary | ICD-10-CM | POA: Diagnosis not present

## 2024-05-01 ENCOUNTER — Other Ambulatory Visit (HOSPITAL_COMMUNITY): Payer: Self-pay | Admitting: Sports Medicine

## 2024-05-01 DIAGNOSIS — M25511 Pain in right shoulder: Secondary | ICD-10-CM

## 2024-05-02 ENCOUNTER — Ambulatory Visit (HOSPITAL_COMMUNITY)
Admission: RE | Admit: 2024-05-02 | Discharge: 2024-05-02 | Disposition: A | Discharge status: Home / Self Care | Source: Ambulatory Visit | Attending: Cardiology | Admitting: Cardiology

## 2024-05-02 DIAGNOSIS — I5022 Chronic systolic (congestive) heart failure: Secondary | ICD-10-CM | POA: Diagnosis not present

## 2024-05-02 DIAGNOSIS — E785 Hyperlipidemia, unspecified: Secondary | ICD-10-CM | POA: Insufficient documentation

## 2024-05-02 DIAGNOSIS — I2510 Atherosclerotic heart disease of native coronary artery without angina pectoris: Secondary | ICD-10-CM

## 2024-05-02 DIAGNOSIS — I10 Essential (primary) hypertension: Secondary | ICD-10-CM

## 2024-05-02 DIAGNOSIS — I2109 ST elevation (STEMI) myocardial infarction involving other coronary artery of anterior wall: Secondary | ICD-10-CM

## 2024-05-02 LAB — ECHOCARDIOGRAM COMPLETE
Area-P 1/2: 2.42 cm2
S' Lateral: 3.1 cm

## 2024-05-07 ENCOUNTER — Ambulatory Visit: Payer: Self-pay | Admitting: Nurse Practitioner

## 2024-05-07 NOTE — Progress Notes (Signed)
 Cardiology Office Note    Patient Name: Todd Lynn Date of Encounter: 05/07/2024  Primary Care Provider:  Dyane Anthony RAMAN, FNP Primary Cardiologist:  Ozell Fell, MD Primary Electrophysiologist: None   Past Medical History    Past Medical History:  Diagnosis Date   Hyperlipidemia     History of Present Illness  Todd Lynn is a 68 y.o. 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  Todd Lynn was last seen on 02/01/2024 following PCI due to anterior STEMI.  During his visit his blood pressure was elevated and carvedilol  was increased to 6.25 mg and spironolactone  12.5 mg was started.  He denied any chest pain and was cleared to proceed with cardiac rehab.  He was referred to the lipid clinic due to elevated LP(a) to discuss PCSK9 inhibitors with plan to add in the future.  Todd Lynn presents today for 85-month follow-up.  Since his heart attack, he has been cautious about overexertion due to breathlessness. He has been adjusting his diet, aiming to keep his sodium intake around 2300 milligrams per day. He consumes [place] yogurt with blackberries or blueberries and incorporates fruits and vegetables into his diet. He tries to go without meat twice a week and is cautious about cholesterol and sodium intake, especially during family gatherings and holidays. He has lost weight, now weighing 225 pounds, down from 242 pounds. He has cravings for sweets but mainly satisfies them with fruit, occasionally having desserts. He avoids chicken due to gagging and prefers fish like trout and salmon, which he sometimes catches himself. He uses canola oil for frying fish and is mindful of not deep frying heavily. Patient denies chest pain, palpitations, dyspnea, PND, orthopnea, nausea, vomiting, dizziness, syncope, edema, weight gain, or early satiety.  Discussed the use of AI scribe software for clinical  note transcription with the patient, who gave verbal consent to proceed.  History of Present Illness   Review of Systems  Please see the history of present illness.    All other systems reviewed and are otherwise negative except as noted above.  Physical Exam    Wt Readings from Last 3 Encounters:  02/25/24 225 lb 1.4 oz (102.1 kg)  02/25/24 225 lb 1.4 oz (102.1 kg)  02/01/24 225 lb (102.1 kg)   CD:Uyzmz were no vitals filed for this visit.,There is no height or weight on file to calculate BMI. GEN: Well nourished, well developed in no acute distress Neck: No JVD; No carotid bruits Pulmonary: Clear to auscultation without rales, wheezing or rhonchi  Cardiovascular: Normal rate. Regular rhythm. Normal S1. Normal S2.   Murmurs: There is no murmur.  ABDOMEN: Soft, non-tender, non-distended EXTREMITIES:  No edema; No deformity   EKG/LABS/ Recent Cardiac Studies   ECG personally reviewed by me today -none completed today  Risk Assessment/Calculations:          Lab Results  Component Value Date   WBC 9.2 01/23/2024   HGB 13.8 01/23/2024   HCT 42.0 01/23/2024   MCV 83.7 01/23/2024   PLT 259 01/23/2024   Lab Results  Component Value Date   CREATININE 0.95 01/23/2024   BUN 15 01/23/2024   NA 137 01/23/2024   K 3.7 01/23/2024   CL 107 01/23/2024   CO2 19 (L) 01/23/2024   Lab Results  Component Value Date   CHOL 119 02/15/2024   HDL 44 02/15/2024   LDLCALC 60 02/15/2024  TRIG 75 02/15/2024   CHOLHDL 2.7 02/15/2024    Lab Results  Component Value Date   HGBA1C 6.2 (H) 01/22/2024   Assessment & Plan    Assessment & Plan  1.Coronary artery disease: -s/p anterior STEMI with PCI/DES to proximal/mid LAD -Today patient reports no chest pain or angina since previous follow-up. - Continue ASA 81 mg, Lipitor 80 mg, Coreg  6.25 mg twice daily, Effient  10 mg  2. HFmrEF: -2D echo completed on 05/02/2024 with improved EF of 55 to 60% and no RWMA -Continue spironolactone   12.5 mg, losartan  25 mg, carvedilol  6.25 mg -Low sodium diet, fluid restriction <2L, and daily weights encouraged. Educated to contact our office for weight gain of 2 lbs overnight or 5 lbs in one week.   3.  Essential hypertension: - Patient's blood pressure is stable at 120/78 - Continue spironolactone  12.5 mg daily, losartan  25 mg, carvedilol  6.25 mg twice daily  4.  Hyperlipidemia -Cholesterol levels at goal, . Discussed PCSK9 inhibitors as future option if cholesterol levels rise.  -He is cautious about injections but open to trying if necessary. - Order fasting lipid panel and LFTs. - Consider PCSK9 inhibitors if cholesterol levels are elevated.     Disposition: Follow-up with Ozell Fell, MD or APP in 6 months   Signed, Wyn Raddle, Jackee Shove, NP 05/07/2024, 10:20 AM [facility]

## 2024-05-08 ENCOUNTER — Encounter: Payer: Self-pay | Admitting: Nurse Practitioner

## 2024-05-08 ENCOUNTER — Ambulatory Visit: Attending: Cardiology | Admitting: Nurse Practitioner

## 2024-05-08 VITALS — BP 120/78 | HR 62 | Ht 68.0 in | Wt 225.4 lb

## 2024-05-08 DIAGNOSIS — E785 Hyperlipidemia, unspecified: Secondary | ICD-10-CM | POA: Diagnosis not present

## 2024-05-08 DIAGNOSIS — I2510 Atherosclerotic heart disease of native coronary artery without angina pectoris: Secondary | ICD-10-CM | POA: Diagnosis not present

## 2024-05-08 DIAGNOSIS — I10 Essential (primary) hypertension: Secondary | ICD-10-CM | POA: Diagnosis not present

## 2024-05-08 DIAGNOSIS — I5022 Chronic systolic (congestive) heart failure: Secondary | ICD-10-CM

## 2024-05-08 NOTE — Patient Instructions (Addendum)
 Medication Instructions:  Your physician recommends that you continue on your current medications as directed. Please refer to the Current Medication list given to you today. *If you need a refill on your cardiac medications before your next appointment, please call your pharmacy*  Lab Work: TODAY-LFT & LIPIDS If you have labs (blood work) drawn today and your tests are completely normal, you will receive your results only by: MyChart Message (if you have MyChart) OR A paper copy in the mail If you have any lab test that is abnormal or we need to change your treatment, we will call you to review the results.  Testing/Procedures: NONE ORDERED  Follow-Up: At [facility], you and your health needs are our priority.  As part of our continuing mission to provide you with exceptional heart care, our providers are all part of one team.  This team includes your primary Cardiologist (physician) and Advanced Practice Providers or APPs (Physician Assistants and Nurse Practitioners) who all work together to provide you with the care you need, when you need it.  Your next appointment:   6 month(s)  Provider:   Ozell Fell, MD    We recommend signing up for the patient portal called MyChart.  Sign up information is provided on this After Visit Summary.  MyChart is used to connect with patients for Virtual Visits (Telemedicine).  Patients are able to view lab/test results, encounter notes, upcoming appointments, etc.  Non-urgent messages can be sent to your provider as well.   To learn more about what you can do with MyChart, go to [email or web address].   Other Instructions Dr Deatrice Cage did you cath

## 2024-05-09 ENCOUNTER — Ambulatory Visit: Payer: Self-pay | Admitting: Nurse Practitioner

## 2024-05-09 LAB — HEPATIC FUNCTION PANEL
ALT: 28 IU/L (ref 0–44)
AST: 19 IU/L (ref 0–40)
Albumin: 4.1 g/dL (ref 3.9–4.9)
Alkaline Phosphatase: 96 IU/L (ref 44–121)
Bilirubin Total: 0.5 mg/dL (ref 0.0–1.2)
Bilirubin, Direct: 0.18 mg/dL (ref 0.00–0.40)
Total Protein: 7.4 g/dL (ref 6.0–8.5)

## 2024-05-09 LAB — LIPID PANEL
Chol/HDL Ratio: 2.7 ratio (ref 0.0–5.0)
Cholesterol, Total: 125 mg/dL (ref 100–199)
HDL: 47 mg/dL (ref >39)
LDL Chol Calc (NIH): 60 mg/dL (ref 0–99)
Triglycerides: 93 mg/dL (ref 0–149)
VLDL Cholesterol Cal: 18 mg/dL (ref 5–40)

## 2024-05-17 ENCOUNTER — Other Ambulatory Visit: Payer: Self-pay

## 2024-05-18 ENCOUNTER — Ambulatory Visit (HOSPITAL_COMMUNITY)
Admission: RE | Admit: 2024-05-18 | Discharge: 2024-05-18 | Disposition: A | Discharge status: Home / Self Care | Source: Ambulatory Visit | Attending: Sports Medicine | Admitting: Sports Medicine

## 2024-05-18 DIAGNOSIS — M7581 Other shoulder lesions, right shoulder: Secondary | ICD-10-CM | POA: Diagnosis not present

## 2024-05-18 DIAGNOSIS — M25511 Pain in right shoulder: Secondary | ICD-10-CM | POA: Insufficient documentation

## 2024-05-18 DIAGNOSIS — M19011 Primary osteoarthritis, right shoulder: Secondary | ICD-10-CM | POA: Diagnosis not present

## 2024-05-18 DIAGNOSIS — M75121 Complete rotator cuff tear or rupture of right shoulder, not specified as traumatic: Secondary | ICD-10-CM | POA: Diagnosis not present

## 2024-05-23 ENCOUNTER — Other Ambulatory Visit (HOSPITAL_COMMUNITY): Payer: Self-pay

## 2024-05-23 MED ORDER — CARVEDILOL 6.25 MG PO TABS
6.2500 mg | ORAL_TABLET | Freq: Two times a day (BID) | ORAL | 6 refills | Status: DC
  Filled 2024-05-23: qty 60, 30d supply, fill #0
  Filled 2024-06-21: qty 60, 30d supply, fill #1
  Filled 2024-07-24: qty 60, 30d supply, fill #2

## 2024-05-24 ENCOUNTER — Other Ambulatory Visit (HOSPITAL_COMMUNITY): Payer: Self-pay

## 2024-05-29 ENCOUNTER — Telehealth: Payer: Self-pay

## 2024-05-29 DIAGNOSIS — S46011A Strain of muscle(s) and tendon(s) of the rotator cuff of right shoulder, initial encounter: Secondary | ICD-10-CM | POA: Diagnosis not present

## 2024-05-29 NOTE — Telephone Encounter (Signed)
   Pre-operative Risk Assessment    Patient Name: Todd Lynn  DOB: [birth year] MRN: [id number]   Date of last office visit: 05/08/24 JACKEE WYN RADDLE, NP Date of next office visit: NONE   Request for Surgical Clearance    Procedure:  RIGHT SHOULDER SCOPE, ROTATOR CUFF REPAIR, OPEN DISTAL CLAVICAL EXCISION  Date of Surgery:  Clearance TBD                                Surgeon:  BONNER HAIR, MD Surgeon's Group or Practice Name:  [facility] Phone number:  [phone]  [phone] Fax number:  [phone]   ATTN: MAEOLA DIVERS   Type of Clearance Requested:   - Medical  - Pharmacy:  Hold Aspirin  and Prasugrel  (Effient )     Type of Anesthesia:  General  W/ INTERSCALENE BLOCK   Additional requests/questions:    Signed, Lucie DELENA Ku   05/29/2024, 3:52 PM

## 2024-05-30 NOTE — Telephone Encounter (Signed)
 Following STEMI, pt should wait 12 months for surgery if reasonable/appropriate to wait. If surgery is somewhat urgent, it is reasonable to proceed after 6 months at which time he could hold effient  x 7 days for surgery. Like many of these situations, the risk and benefit (need for surgery versus risk of stopping antiplatelet medication) need to be weighed in deciding timing of surgery. Let me know if you need more information or to set up a visit with me to discuss with the patient. thanks

## 2024-05-30 NOTE — Telephone Encounter (Signed)
   Name:  Todd Lynn  DOB:  Jan 10, 1956  MRN:  [id number]   Primary Cardiologist: Ozell Fell, MD  Chart reviewed as part of pre-operative protocol coverage. Patient was contacted 05/30/2024 in reference to pre-operative risk assessment for pending surgery as outlined below.    Per Dr. Fell 05/30/2024 Following STEMI, pt should wait 12 months for surgery if reasonable/appropriate to wait. If surgery is somewhat urgent, it is reasonable to proceed after 6 months at which time he could hold effient  x 7 days for surgery. Like many of these situations, the risk and benefit (need for surgery versus risk of stopping antiplatelet medication) need to be weighed in deciding timing of surgery. Let me know if you need more information or to set up a visit with me to discuss with the patient. thanks    - Please contact requesting surgeon's office via preferred method (i.e, phone, fax) to inform them if need for appointment prior to surgery.  [name], NP 05/30/2024, 1:32 PM

## 2024-05-30 NOTE — Telephone Encounter (Signed)
 Dr. Wonda  You saw this patient on 01/22/2024 in the setting of an anterior STEMI. He was found to have severe ostial stenosis in the Ramus and moderate ostial LAD. You placed a SYNERGY XD 2.75X20 to the LAD. He is on Effient . Saw Jackee Alberts, NP on 05/08/2024.  He is due to have RIGHT SHOULDER SCOPE, ROTATOR CUFF REPAIR, OPEN DISTAL CLAVICAL EXCISION on TBD. Per protocol we request that you comment on his cardiac risk to proceed with surgery since it has been less than 6  months since his intervention. Ideally like to wait a year for elective procedures. Please send your comment to P CV Pre-Op Pool.  Thank you, Todd Satterfield DNP, ANP, AACC.

## 2024-05-30 NOTE — Telephone Encounter (Signed)
   Patient Name: Todd Lynn  DOB: 05-Oct-1956 MRN: [id number]  Primary Cardiologist: Ozell Fell, MD  Chart reviewed as part of pre-operative protocol coverage. Given past medical history and time since last visit, based on ACC/AHA guidelines, Todd Lynn is at acceptable risk for the planned procedure without further cardiovascular testing.   The patient was advised that if he develops new symptoms prior to surgery to contact our office to arrange for a follow-up visit, and he verbalized understanding.  I will route this recommendation to the requesting party via Epic fax function and remove from pre-op pool.  Please call with questions.  [name], NP 05/30/2024,

## 2024-05-31 DIAGNOSIS — I213 ST elevation (STEMI) myocardial infarction of unspecified site: Secondary | ICD-10-CM | POA: Diagnosis not present

## 2024-05-31 DIAGNOSIS — E782 Mixed hyperlipidemia: Secondary | ICD-10-CM | POA: Diagnosis not present

## 2024-05-31 DIAGNOSIS — I5021 Acute systolic (congestive) heart failure: Secondary | ICD-10-CM | POA: Diagnosis not present

## 2024-07-01 DIAGNOSIS — I5021 Acute systolic (congestive) heart failure: Secondary | ICD-10-CM | POA: Diagnosis not present

## 2024-07-01 DIAGNOSIS — E782 Mixed hyperlipidemia: Secondary | ICD-10-CM | POA: Diagnosis not present

## 2024-07-01 DIAGNOSIS — I213 ST elevation (STEMI) myocardial infarction of unspecified site: Secondary | ICD-10-CM | POA: Diagnosis not present

## 2024-07-12 DIAGNOSIS — R0683 Snoring: Secondary | ICD-10-CM | POA: Diagnosis not present

## 2024-07-12 DIAGNOSIS — Z23 Encounter for immunization: Secondary | ICD-10-CM | POA: Diagnosis not present

## 2024-07-12 DIAGNOSIS — M25511 Pain in right shoulder: Secondary | ICD-10-CM | POA: Diagnosis not present

## 2024-07-19 ENCOUNTER — Other Ambulatory Visit: Payer: Self-pay | Admitting: Cardiology

## 2024-07-19 ENCOUNTER — Telehealth: Payer: Self-pay | Admitting: Cardiovascular Disease

## 2024-07-19 ENCOUNTER — Other Ambulatory Visit (HOSPITAL_COMMUNITY): Payer: Self-pay

## 2024-07-19 MED ORDER — LOSARTAN POTASSIUM 25 MG PO TABS
25.0000 mg | ORAL_TABLET | Freq: Every day | ORAL | 3 refills | Status: AC
  Filled 2024-07-19: qty 90, 90d supply, fill #0
  Filled 2024-10-18: qty 90, 90d supply, fill #1

## 2024-07-19 MED ORDER — ATORVASTATIN CALCIUM 80 MG PO TABS
80.0000 mg | ORAL_TABLET | Freq: Every day | ORAL | 3 refills | Status: AC
  Filled 2024-07-19: qty 90, 90d supply, fill #0
  Filled 2024-10-16: qty 90, 90d supply, fill #1

## 2024-07-19 NOTE — Telephone Encounter (Signed)
   Pre-operative Risk Assessment    Patient Name: Todd Lynn  DOB: December 08, 1955 MRN: [id number]   Date of last office visit: 05/08/24 Date of next office visit: Not yet scheduled   Request for Surgical Clearance    Procedure:  Routine cleaning  Date of Surgery:  Clearance 07/19/24                                Surgeon:   Dr. Cesario Socks Group or Practice Name:  [facility] Phone number:  [phone]  Fax number:  [phone]   Type of Clearance Requested:   - Medical    Type of Anesthesia:  None    Additional requests/questions:  Caller Anice) stated patient is only having a routine cleaning today but if patient needs further dental work will need medical and pharmacy clearance.  Signed, Jasmin B Wilson   07/19/2024, 1:23 PM

## 2024-07-19 NOTE — Telephone Encounter (Signed)
    Primary Cardiologist: Ozell Fell, MD  Chart reviewed as part of pre-operative protocol coverage. Simple dental extractions are considered low risk procedures per guidelines and generally do not require any specific cardiac clearance. It is also generally accepted that for simple extractions and dental cleanings, there is no need to interrupt blood thinner therapy.   SBE prophylaxis is not required for the patient.  I will route this recommendation to the requesting party via Epic fax function and remove from pre-op pool.  Please call with questions.  Orren LOISE Fabry, PA-C 07/19/2024, 1:33 PM

## 2024-07-24 ENCOUNTER — Other Ambulatory Visit (HOSPITAL_COMMUNITY): Payer: Self-pay

## 2024-07-24 MED ORDER — SPIRONOLACTONE 25 MG PO TABS
25.0000 mg | ORAL_TABLET | Freq: Every day | ORAL | 1 refills | Status: AC
  Filled 2024-07-24: qty 90, 90d supply, fill #0

## 2024-07-27 ENCOUNTER — Telehealth: Payer: Self-pay

## 2024-07-27 ENCOUNTER — Other Ambulatory Visit (HOSPITAL_COMMUNITY): Payer: Self-pay

## 2024-07-27 NOTE — Telephone Encounter (Signed)
 Pt stated that he is not doing anything with [facility] until after he shoulder surgery  - called [facility] to let them know she said that was fine

## 2024-07-27 NOTE — Telephone Encounter (Signed)
   Pre-operative Risk Assessment    Patient Name: Todd Lynn  DOB: 1955-11-06 MRN: [id number]   Date of last office visit: 05/08/24 with Jackee Date of next office visit: none  Request for Surgical Clearance    Procedure:  Dental Extraction - Amount of Teeth to be Pulled:  4 teeth  Date of Surgery:  Clearance TBD                                 Surgeon:  Not Listed Surgeon's Group or Practice Name:  [facility] number:  [phone] Fax number:  [phone]   Type of Clearance Requested:   - Medical  - Pharmacy:  Hold Aspirin      Type of Anesthesia:  Local    Additional requests/questions:  N/A  SignedMerlynn LITTIE Essex   07/27/2024, 12:03 PM

## 2024-07-27 NOTE — Telephone Encounter (Signed)
 S.w [employer]. They will need recommendations for what ever blood thinners the pt may be on. Also the Dr. Cesario, and well as the extractions are going to be surgical.

## 2024-07-27 NOTE — Telephone Encounter (Signed)
 Patient is on Effient  (Prasugrel ) please call dentist to clarify if they need to hold for dental extraction, so that we can get pharmacy recommendations. He will need a VV as well once pharmacy has weighed in. Procedure is scheduled for TBD (4 teeth extraction).

## 2024-07-27 NOTE — Telephone Encounter (Signed)
 Pharmacy please advise on holding Effient  prior to 4 teeth extraction scheduled for TBD. (CBC) 01/23/2024 and (BMET) 02/01/2024. Thank you.

## 2024-07-27 NOTE — Telephone Encounter (Signed)
   Name: Todd Lynn  DOB: 07-19-1956  MRN: [id number]  Primary Cardiologist: Ozell Fell, MD   Preoperative team, please contact this patient and set up a phone call appointment for further preoperative risk assessment. Please obtain consent and complete medication review. Thank you for your help.  I confirm that guidance regarding antiplatelet and oral anticoagulation therapy has been completed and, if necessary, noted below.  Pharmacy has been notified of need for recommendations concerning Effient  hold.   I also confirmed the patient resides in the state of [place] . As per [facility] Medical Board telemedicine laws, the patient must reside in the state in which the provider is licensed.   [name], NP 07/27/2024, 1:07 PM [facility]

## 2024-07-27 NOTE — Telephone Encounter (Signed)
 Left message to call back to schedule tele pre op appt.

## 2024-07-31 DIAGNOSIS — I213 ST elevation (STEMI) myocardial infarction of unspecified site: Secondary | ICD-10-CM | POA: Diagnosis not present

## 2024-07-31 DIAGNOSIS — I5021 Acute systolic (congestive) heart failure: Secondary | ICD-10-CM | POA: Diagnosis not present

## 2024-07-31 DIAGNOSIS — E782 Mixed hyperlipidemia: Secondary | ICD-10-CM | POA: Diagnosis not present

## 2024-08-08 NOTE — Progress Notes (Signed)
 PCP - Anthony Hint, FNP Cardiologist - Ozell Fell, MD  LOV 05-08-24 epic Wyn Manus, NP  PPM/ICD -  Device Orders -  Rep Notified -   Chest x-ray - 01-22-24 epic EKG - 02-01-24 epic Stress Test -  ECHO - 05-02-24 epic Cardiac Cath - 01-22-24 epic  Sleep Study -  CPAP -   Fasting Blood Sugar -  Checks Blood Sugar _____ times a day  Blood Thinner Instructions: Aspirin  Instructions:  ERAS Protcol - PRE-SURGERY Ensure or G2-    COVID vaccine -  Activity-- Anesthesia review:   Patient denies shortness of breath, fever, cough and chest pain at PAT appointment   All instructions explained to the patient, with a verbal understanding of the material. Patient agrees to go over the instructions while at home for a better understanding. Patient also instructed to self quarantine after being tested for COVID-19. The opportunity to ask questions was provided.

## 2024-08-08 NOTE — Patient Instructions (Signed)
 SURGICAL WAITING ROOM VISITATION  Patients having surgery or a procedure may have no more than 2 support people in the waiting area - these visitors may rotate.    Children under the age of 21 must have an adult with them who is not the patient.  Visitors with respiratory illnesses are discouraged from visiting and should remain at home.  If the patient needs to stay at the hospital during part of their recovery, the visitor guidelines for inpatient rooms apply. Pre-op nurse will coordinate an appropriate time for 1 support person to accompany patient in pre-op.  This support person may not rotate.    Please refer to the [facility] website for the visitor guidelines for Inpatients (after your surgery is over and you are in a regular room).       Your procedure is scheduled on: 08-22-24    Report to [facility] Main Entrance    Report to admitting at      0945  AM   Call this number if you have problems the morning of surgery [phone]   Do not eat food :After Midnight.   After Midnight you may have the following liquids until __0600____ AM/ DAY OF SURGERY  THEN NOTHING BY MOUTH  Water                                             Sports drinks like Gatorade (NO RED)                          If you have questions, please contact your surgeon's office.   FOLLOW  ANY ADDITIONAL PRE OP INSTRUCTIONS YOU RECEIVED FROM YOUR SURGEON'S OFFICE!!!     Oral Hygiene is also important to reduce your risk of infection.                                    Remember - BRUSH YOUR TEETH THE MORNING OF SURGERY WITH YOUR REGULAR TOOTHPASTE  DENTURES WILL BE REMOVED PRIOR TO SURGERY PLEASE DO NOT APPLY Poly grip OR ADHESIVES!!!   Do NOT smoke after Midnight   Stop all vitamins and herbal supplements 7 days before surgery.   Take these medicines the morning of surgery with A SIP OF WATER: Carvedilol , Atorvastatin                        Hold Effient  per your doctors order  DO NOT  TAKE ANY ORAL DIABETIC MEDICATIONS DAY OF YOUR SURGERY  Bring CPAP mask and tubing day of surgery.                              You may not have any metal on your body including hair pins, jewelry, and body piercing             Do not wear lotions, powders, perfumes/cologne, or deodorant                 Men may shave face and neck.   Do not bring valuables to the hospital. [facility] IS NOT             RESPONSIBLE   FOR VALUABLES.   Contacts, glasses,  dentures or bridgework may not be worn into surgery.   Bring small overnight bag day of surgery.   DO NOT BRING YOUR HOME MEDICATIONS TO THE HOSPITAL. PHARMACY WILL DISPENSE MEDICATIONS LISTED ON YOUR MEDICATION LIST TO YOU DURING YOUR ADMISSION IN THE HOSPITAL!    Patients discharged on the day of surgery will not be allowed to drive home.  Someone NEEDS to stay with you for the first 24 hours after anesthesia.   Special Instructions: Bring a copy of your healthcare power of attorney and living will documents the day of surgery if you haven't scanned them before.              Please read over the following fact sheets you were given: IF YOU HAVE QUESTIONS ABOUT YOUR PRE-OP INSTRUCTIONS PLEASE CALL [phone].    If you test positive for Covid or have been in contact with anyone that has tested positive in the last 10 days please notify you surgeon. [facility] - Preparing for Surgery Before surgery, you can play an important role.  Because skin is not sterile, your skin needs to be as free of germs as possible.  You can reduce the number of germs on your skin by washing with CHG (chlorahexidine gluconate) soap before surgery.  CHG is an antiseptic cleaner which kills germs and bonds with the skin to continue killing germs even after washing. Please DO NOT use if you have an allergy to CHG or antibacterial soaps.  If your skin becomes reddened/irritated stop using the CHG and inform your nurse when you arrive at Short Stay. Do not shave  (including legs and underarms) for at least 48 hours prior to the first CHG shower.  You may shave your face/neck.  Please follow these instructions carefully:  1.  Shower with CHG Soap the night before surgery ONLY (DO NOT USE THE SOAP THE MORNING OF SURGERY).  2.  If you choose to wash your hair, wash your hair first as usual with your normal  shampoo.  3.  After you shampoo, rinse your hair and body thoroughly to remove the shampoo.                             4.  Use CHG as you would any other liquid soap.  You can apply chg directly to the skin and wash.  Gently with a scrungie or clean washcloth.  5.  Apply the CHG Soap to your body ONLY FROM THE NECK DOWN.   Do not use on face/ open                           Wound or open sores. Avoid contact with eyes, ears mouth and genitals (private parts).                       Wash face,  Genitals (private parts) with your normal soap.             6.  Wash thoroughly, paying special attention to the area where your  surgery  will be performed.  7.  Thoroughly rinse your body with warm water from the neck down.  8.  DO NOT shower/wash with your normal soap after using and rinsing off the CHG Soap.                9.  Pat yourself dry with a clean  towel.            10.  Wear clean pajamas.            11.  Place clean sheets on your bed the night of your first shower and do not  sleep with pets. Day of Surgery : Do not apply any CHG, lotions/deodorants the morning of surgery.  Please wear clean clothes to the hospital/surgery center.  FAILURE TO FOLLOW THESE INSTRUCTIONS MAY RESULT IN THE CANCELLATION OF YOUR SURGERY  PATIENT SIGNATURE_________________________________  NURSE SIGNATURE__________________________________  ________________________________________________________________________

## 2024-08-10 ENCOUNTER — Other Ambulatory Visit: Payer: Self-pay

## 2024-08-10 ENCOUNTER — Encounter (HOSPITAL_COMMUNITY): Payer: Self-pay

## 2024-08-10 ENCOUNTER — Encounter (HOSPITAL_COMMUNITY)
Admission: RE | Admit: 2024-08-10 | Discharge: 2024-08-10 | Disposition: A | Discharge status: Home / Self Care | Source: Ambulatory Visit | Attending: Orthopaedic Surgery | Admitting: Orthopaedic Surgery

## 2024-08-10 VITALS — BP 142/99 | HR 58 | Temp 98.2°F | Resp 16 | Ht 68.0 in | Wt 225.0 lb

## 2024-08-10 DIAGNOSIS — I2510 Atherosclerotic heart disease of native coronary artery without angina pectoris: Secondary | ICD-10-CM | POA: Insufficient documentation

## 2024-08-10 DIAGNOSIS — Z955 Presence of coronary angioplasty implant and graft: Secondary | ICD-10-CM | POA: Insufficient documentation

## 2024-08-10 DIAGNOSIS — Z7902 Long term (current) use of antithrombotics/antiplatelets: Secondary | ICD-10-CM | POA: Insufficient documentation

## 2024-08-10 DIAGNOSIS — M7521 Bicipital tendinitis, right shoulder: Secondary | ICD-10-CM | POA: Diagnosis not present

## 2024-08-10 DIAGNOSIS — I110 Hypertensive heart disease with heart failure: Secondary | ICD-10-CM | POA: Insufficient documentation

## 2024-08-10 DIAGNOSIS — M24111 Other articular cartilage disorders, right shoulder: Secondary | ICD-10-CM | POA: Insufficient documentation

## 2024-08-10 DIAGNOSIS — M19011 Primary osteoarthritis, right shoulder: Secondary | ICD-10-CM | POA: Insufficient documentation

## 2024-08-10 DIAGNOSIS — M7541 Impingement syndrome of right shoulder: Secondary | ICD-10-CM | POA: Insufficient documentation

## 2024-08-10 DIAGNOSIS — I509 Heart failure, unspecified: Secondary | ICD-10-CM | POA: Diagnosis not present

## 2024-08-10 DIAGNOSIS — Z01818 Encounter for other preprocedural examination: Secondary | ICD-10-CM | POA: Diagnosis present

## 2024-08-10 DIAGNOSIS — I252 Old myocardial infarction: Secondary | ICD-10-CM | POA: Diagnosis not present

## 2024-08-10 DIAGNOSIS — S46011A Strain of muscle(s) and tendon(s) of the rotator cuff of right shoulder, initial encounter: Secondary | ICD-10-CM | POA: Diagnosis not present

## 2024-08-10 DIAGNOSIS — Z01812 Encounter for preprocedural laboratory examination: Secondary | ICD-10-CM | POA: Diagnosis not present

## 2024-08-10 DIAGNOSIS — I2109 ST elevation (STEMI) myocardial infarction involving other coronary artery of anterior wall: Secondary | ICD-10-CM

## 2024-08-10 HISTORY — DX: Unspecified osteoarthritis, unspecified site: M19.90

## 2024-08-10 HISTORY — DX: Acute myocardial infarction, unspecified: I21.9

## 2024-08-10 HISTORY — DX: Essential (primary) hypertension: I10

## 2024-08-10 HISTORY — DX: Heart failure, unspecified: I50.9

## 2024-08-10 LAB — CBC
HCT: 47.7 % (ref 39.0–52.0)
Hemoglobin: 14.6 g/dL (ref 13.0–17.0)
MCH: 26.4 pg (ref 26.0–34.0)
MCHC: 30.6 g/dL (ref 30.0–36.0)
MCV: 86.4 fL (ref 80.0–100.0)
Platelets: 282 K/uL (ref 150–400)
RBC: 5.52 MIL/uL (ref 4.22–5.81)
RDW: 14.6 % (ref 11.5–15.5)
WBC: 11.4 K/uL — ABNORMAL HIGH (ref 4.0–10.5)
nRBC: 0 % (ref 0.0–0.2)

## 2024-08-10 LAB — SURGICAL PCR SCREEN
MRSA, PCR: NEGATIVE
Staphylococcus aureus: POSITIVE — AB

## 2024-08-10 LAB — BASIC METABOLIC PANEL WITH GFR
Anion gap: 8 (ref 5–15)
BUN: 18 mg/dL (ref 8–23)
CO2: 25 mmol/L (ref 22–32)
Calcium: 10 mg/dL (ref 8.9–10.3)
Chloride: 103 mmol/L (ref 98–111)
Creatinine, Ser: 1.03 mg/dL (ref 0.61–1.24)
GFR, Estimated: >60 mL/min (ref >60)
Glucose, Bld: 105 mg/dL — ABNORMAL HIGH (ref 70–99)
Potassium: 4.5 mmol/L (ref 3.5–5.1)
Sodium: 137 mmol/L (ref 135–145)

## 2024-08-13 ENCOUNTER — Telehealth (HOSPITAL_BASED_OUTPATIENT_CLINIC_OR_DEPARTMENT_OTHER): Payer: Self-pay | Admitting: *Deleted

## 2024-08-13 ENCOUNTER — Encounter (HOSPITAL_COMMUNITY): Payer: Self-pay

## 2024-08-13 NOTE — Progress Notes (Addendum)
 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  Patient was admitted for anterior STEMI in March 2025.  Drug-eluting stent was placed to the LAD on 01/22/2024.  Echo with mild ischemic cardiomyopathy and EF 40 to 45%.  Patient started on GDMT and followed up in clinic on 05/08/2024.  Echo was repeated and showed normal EF of 55 to 60%.  Patient noted to be doing well, without cardiac symptoms.  Cardiac clearance for upcoming shoulder surgery addressed in 05/30/2024 telephone note by Dr. Wonda:  Following STEMI, pt should wait 12 months for surgery if reasonable/appropriate to wait. If surgery is somewhat urgent, it is reasonable to proceed after 6 months at which time he could hold effient  x 7 days for surgery. Like many of these situations, the risk and benefit (need for surgery versus risk of stopping antiplatelet medication) need to be weighed in deciding timing of surgery. Let me know if you need more information or to set up a visit with me to discuss with the patient. Thanks  Discussed with Dr. Darlyn who discussed with Dr. Cristy regarding recommendations by cardiology.  Dr. Cristy recommending  to proceed since waiting would lead to patient requiring a total shoulder replacement  Discussed with patient. He states he is aware of Dr. Noelle recommendations and he understands the risk of coming off his Effient  at 6 months as opposed to waiting for 12 months. He states he has had this conversation with Dr. Wonda and Dr. Cristy and wishes to proceed.  Last dose Effient  08-14-24  VS: BP (!) 142/99   Pulse (!) 58   Temp 36.8 C (Oral)   Resp 16   Ht 5' 8 (1.727 m)   Wt 102.1 kg   SpO2 99%   BMI 34.21 kg/m   PROVIDERS: Dyane Anthony RAMAN, FNP   LABS: Labs reviewed: Acceptable for surgery. (all labs ordered are listed, but only abnormal results are displayed)  Labs Reviewed  SURGICAL PCR SCREEN - Abnormal; Notable for the following components:      Result Value   Staphylococcus aureus POSITIVE (*)    All other components within normal limits  BASIC METABOLIC PANEL WITH GFR - Abnormal; Notable for the following components:   Glucose, Bld 105 (*)    All other components within normal limits  CBC - Abnormal; Notable for the following components:   WBC 11.4 (*)    All other components within normal limits     IMAGES:   EKG 02/01/24  Sinus bradycardia, rate 59  T wave inversion in lateral leads not acute change comared to recent EKG  Echo 05/02/2024:  IMPRESSIONS    1. Left  ventricular ejection fraction, by estimation, is 55 to 60%. The left ventricle has normal function. The left ventricle has no regional wall motion abnormalities. Left ventricular diastolic parameters were normal. The average left ventricular global longitudinal strain is -14.4 %. The global longitudinal strain is abnormal.  2. Right ventricular systolic function is normal. The right ventricular size is normal.  3. The mitral valve is normal in structure. No evidence of mitral valve regurgitation. No evidence of mitral stenosis.  4. The aortic valve is tricuspid. Aortic valve regurgitation is  not visualized. No aortic stenosis is present.  5. The inferior vena cava is normal in size with greater than 50% respiratory variability, suggesting right atrial pressure of 3 mmHg.  Left heart cath 01/22/2024:    Ramus lesion is 95% stenosed.   Ost LAD lesion is 50% stenosed.   Prox LAD to Mid LAD lesion is 95% stenosed.   Mid LAD lesion is 30% stenosed.   Dist LAD lesion is 80% stenosed.   Prox LAD lesion is 30% stenosed.   Dist LM lesion is 20% stenosed.   Mid RCA lesion is 30% stenosed.   Ost RCA lesion is 20% stenosed.   A drug-eluting stent was successfully placed using a SYNERGY XD 2.75X20.   Post intervention, there is a 0% residual stenosis.   There is moderate to severe left ventricular systolic dysfunction.   LV end diastolic pressure is moderately elevated.   The left ventricular ejection fraction is 25-35% by visual estimate.   In the absence of any other complications or medical issues, we expect the patient to be ready for discharge from an interventional cardiology perspective on 01/24/2024.   Recommend uninterrupted dual antiplatelet therapy with Aspirin  81mg  daily and Prasugrel  10mg  daily for a minimum of 12 months (ACS-Class I recommendation).   1.  Anterior STEMI with inferior changes likely due to wraparound LAD.  The culprit is plaque rupture in the proximal/mid LAD.  In addition, there is severe ostial stenosis in the ramus artery and moderate ostial LAD disease. 2.  Moderately to severely reduced LV systolic function with moderately elevated left ventricular end-diastolic pressure at 24 mmHg. 3.  Successful angioplasty and drug-eluting stent placement to the proximal/mid LAD.   Recommendations: The ramus is not suitable for PCI due to ostial location and presence of moderate ostial LAD disease.  Recommend treatment of residual coronary artery disease medically. Past Medical History:  Diagnosis Date   Arthritis    CHF (congestive heart failure) (HCC)     improved with medications   Hyperlipidemia    Hypertension    Myocardial infarction [facility])    01-22-24  stents    Past Surgical History:  Procedure Laterality Date   CORONARY/GRAFT ACUTE MI REVASCULARIZATION N/A 01/22/2024   Procedure: Coronary/Graft Acute MI Revascularization;  Surgeon: Darron Deatrice LABOR, MD;  Location: [facility] INVASIVE CV LAB;  Service: Cardiovascular;  Laterality: N/A;   HAND SURGERY Right    LEFT HEART CATH AND CORONARY ANGIOGRAPHY N/A 01/22/2024   Procedure: LEFT HEART CATH AND CORONARY ANGIOGRAPHY;  Surgeon: Darron Deatrice LABOR, MD;  Location: [facility] INVASIVE CV LAB;  Service: Cardiovascular;  Laterality: N/A;   NECK SURGERY      MEDICATIONS:  aspirin  81 MG chewable tablet   atorvastatin  (LIPITOR) 80 MG tablet   Blood Pressure Monitoring (BLOOD PRESSURE CUFF) MISC   carvedilol  (COREG ) 6.25 MG tablet   losartan  (COZAAR ) 25 MG tablet   nitroGLYCERIN  (NITROSTAT ) 0.4 MG SL tablet   prasugrel  (EFFIENT ) 10 MG  TABS tablet   spironolactone  (ALDACTONE ) 25 MG tablet   No current facility-administered medications for this encounter.   Burnard CHRISTELLA Odis DEVONNA [facility] Short Stay/Anesthesiology [facility] Phone [phone] 08/13/2024 2:58 PM

## 2024-08-13 NOTE — Anesthesia Preprocedure Evaluation (Addendum)
 Anesthesia Evaluation  Patient identified by MRN, date of birth, ID band Patient awake    Reviewed: Allergy & Precautions, NPO status , Patient's Chart, lab work & pertinent test results  History of Anesthesia Complications Negative for: history of anesthetic complications  Airway Mallampati: III  TM Distance: >3 FB Neck ROM: Full    Dental  (+) Dental Advisory Given   Pulmonary neg pulmonary ROS   Pulmonary exam normal breath sounds clear to auscultation       Cardiovascular hypertension (carvedilol , losartan , spironolactone ), Pt. on medications and Pt. on home beta blockers (-) angina + Past MI, + Cardiac Stents (01/22/2024) and +CHF  (-) CABG, (-) Orthopnea, (-) PND and (-) DOE (-) dysrhythmias  Rhythm:Regular Rate:Normal  HLD  TTE 05/02/2024: IMPRESSIONS    1. Left ventricular ejection fraction, by estimation, is 55 to 60%. The  left ventricle has normal function. The left ventricle has no regional  wall motion abnormalities. Left ventricular diastolic parameters were  normal. The average left ventricular  global longitudinal strain is -14.4 %. The global longitudinal strain is  abnormal.   2. Right ventricular systolic function is normal. The right ventricular  size is normal.   3. The mitral valve is normal in structure. No evidence of mitral valve  regurgitation. No evidence of mitral stenosis.   4. The aortic valve is tricuspid. Aortic valve regurgitation is not  visualized. No aortic stenosis is present.   5. The inferior vena cava is normal in size with greater than 50%  respiratory variability, suggesting right atrial pressure of 3 mmHg.   [facility]HC 01/22/2024:   Ramus lesion is 95% stenosed.   Ost LAD lesion is 50% stenosed.   Prox LAD to Mid LAD lesion is 95% stenosed.   Mid LAD lesion is 30% stenosed.   Dist LAD lesion is 80% stenosed.   Prox LAD lesion is 30% stenosed.   Dist LM lesion is 20% stenosed.    Mid RCA lesion is 30% stenosed.   Ost RCA lesion is 20% stenosed.   A drug-eluting stent was successfully placed using a SYNERGY XD 2.75X20.   Post intervention, there is a 0% residual stenosis.   There is moderate to severe left ventricular systolic dysfunction.   LV end diastolic pressure is moderately elevated.   The left ventricular ejection fraction is 25-35% by visual estimate.   In the absence of any other complications or medical issues, we expect the patient to be ready for discharge from an interventional cardiology perspective on 01/24/2024.   Recommend uninterrupted dual antiplatelet therapy with Aspirin  81mg  daily and Prasugrel  10mg  daily for a minimum of 12 months (ACS-Class I recommendation).   1.  Anterior STEMI with inferior changes likely due to wraparound LAD.  The culprit is plaque rupture in the proximal/mid LAD.  In addition, there is severe ostial stenosis in the ramus artery and moderate ostial LAD disease. 2.  Moderately to severely reduced LV systolic function with moderately elevated left ventricular end-diastolic pressure at 24 mmHg. 3.  Successful angioplasty and drug-eluting stent placement to the proximal/mid LAD.     Neuro/Psych negative neurological ROS     GI/Hepatic negative GI ROS, Neg liver ROS,,,  Endo/Other  negative endocrine ROS    Renal/GU negative Renal ROS     Musculoskeletal  (+) Arthritis ,    Abdominal  (+) + obese  Peds  Hematology negative hematology ROS (+) Lab Results      Component  Value               Date                      WBC                      11.4 (H)            08/10/2024                HGB                      14.6                08/10/2024                HCT                      47.7                08/10/2024                MCV                      86.4                08/10/2024                PLT                      282                 08/10/2024              Anesthesia Other  Findings Last Effient : 08/14/2024  Last aspirin : 08/15/2024  Reproductive/Obstetrics                              Anesthesia Physical Anesthesia Plan  ASA: 3  Anesthesia Plan: General   Post-op Pain Management: Regional block* and Tylenol  PO (pre-op)*   Induction: Intravenous  PONV Risk Score and Plan: 2 and Ondansetron , Dexamethasone , Midazolam  and Treatment may vary due to age or medical condition  Airway Management Planned: Oral ETT  Additional Equipment:   Intra-op Plan:   Post-operative Plan: Extubation in OR  Informed Consent: I have reviewed the patients History and Physical, chart, labs and discussed the procedure including the risks, benefits and alternatives for the proposed anesthesia with the patient or authorized representative who has indicated his/her understanding and acceptance.     Dental advisory given  Plan Discussed with: CRNA and Anesthesiologist  Anesthesia Plan Comments: (See PAT note from 10/10  Per Cardiology, Following STEMI, pt should wait 12 months for surgery if reasonable/appropriate to wait. If surgery is somewhat urgent, it is reasonable to proceed after 6 months at which time he could hold effient  x 7 days for surgery. Like many of these situations, the risk and benefit (need for surgery versus risk of stopping antiplatelet medication) need to be weighed in deciding timing of surgery. Aspirin  should be continued in the perioperative period without interruption unless there is very high risk for bleeding with significant associated morbidity/mortality.   Discussed with patient that stopping his anticoagulation prior to the recommended 12 months increases his risk of in-stent restenosis and a subsequent cardiac event including infarction, cardiac arrest, and death. The patient reports  that he understands these risks and is willing to accept them giving the urgent nature of the shoulder surgery discussed between him and  Dr. Cristy.  Risks of general anesthesia discussed including, but not limited to, sore throat, hoarse voice, chipped/damaged teeth, injury to vocal cords, nausea and vomiting, allergic reactions, lung infection, heart attack, stroke, and death. All questions answered.  )         Anesthesia Quick Evaluation

## 2024-08-13 NOTE — Telephone Encounter (Signed)
 Please see Dr. Margurite telephone note from 05/30/2024 regarding perioperative risk and antiplatelet therapy.  I am in agreement with his recommendations.  Aspirin  should be continued in the perioperative period without interruption unless there is very high risk for bleeding with significant associated morbidity/mortality.  Lonni Hanson, MD [facility]

## 2024-08-13 NOTE — Telephone Encounter (Signed)
-----   Message from Lamarr Hummer sent at 08/13/2024  3:48 PM EDT ----- I totally get it!. Thank you for being so careful.   Niels,  Can you get this gentleman in for a pre op visit or phone visit and make sure they discuss risk and benefits of stopping Effient  <1 year out from STEMI and PCI and run it by an interventional cardiologist (maybe Darron) since he did the intervention. Dr Wonda is out until 10/30 and his surgery is scheduled before then.  Thank you,  KT ----- Message ----- From: Darlyn Rush, MD Sent: 08/13/2024   3:32 PM EDT To: Lamarr JONELLE Hummer, PA-C  Thanks you! It looks like he's scheduled for the preop phone call from you guys for risk assessment. I just don't like proceeding when Wonda says pt should wait 12 months in his note. I understand that if everyone's onboard then we proceed. This patient just seems like he's a bit more NOT straight forward than the usual. ----- Message ----- From: Hummer Lamarr JONELLE, PA-C Sent: 08/13/2024   3:18 PM EDT To: Rush Darlyn, MD; Ozell Wonda, MD  Hey Dr. Carolin ! Dr. Wonda is out on medical leave through 10/29. Looks like this pts surgery is before he gets back. Would you like me to have the pt seen by our pre op clearance team to make a formal recommendation?  Thanks,   KT ----- Message ----- From: Darlyn Rush, MD Sent: 08/13/2024   3:03 PM EDT To: Ozell Wonda, MD  Dr. Wonda, I was asked by the [facility] to review Mr. Kirkpatrick's chart. Obviously, I'm concerned about his risks with a recent STEMI and ~proximal-mid LAD DES. Dr. Cristy has Mr. Wuebker scheduled for 08/2021 for shoulder arthroscopy. I spoke to Dax to make sure he was aware of the risks and how you seemed to prefer him wait 12 months prior to stopping Effient . Dax said that however his shoulder is torn, waiting a year places him at higher risk of needing a total shoulder. It seems like this guy needs to have everyone truly  on board. Thanks, Rush

## 2024-08-13 NOTE — Telephone Encounter (Signed)
   Pre-operative Risk Assessment    Patient Name: Todd Lynn  DOB: 27-May-1956 MRN: [id number]   Date of last office visit: 05/08/24 JACKEE ALBERTS, NP Date of next office visit: NONE   Request for Surgical Clearance    Procedure:  ARTHROSCOPY, SHOULDER, WITH ROTATOR CUFF REPAIR  Date of Surgery:  Clearance 08/22/24                                Surgeon:  DR. BONNER HAIR Surgeon's Group or Practice Name:  [facility] Phone number:  [phone] Fax number:  [phone]   Type of Clearance Requested:   - Medical  - Pharmacy:  Hold Aspirin  and Prasugrel  (Effient )     Type of Anesthesia: GENERAL   Additional requests/questions:    Bonney Niels Jest   08/13/2024, 4:04 PM

## 2024-08-13 NOTE — Telephone Encounter (Signed)
   Patient Name: Todd Lynn  DOB: 11/18/1955 MRN: [id number]  Primary Cardiologist: Ozell Fell, MD  Chart reviewed as part of pre-operative protocol coverage. Pre-op clearance already addressed by colleagues in earlier phone notes. To summarize recommendations:  -Following STEMI, pt should wait 12 months for surgery if reasonable/appropriate to wait. If surgery is somewhat urgent, it is reasonable to proceed after 6 months at which time he could hold effient  x 7 days for surgery. Like many of these situations, the risk and benefit (need for surgery versus risk of stopping antiplatelet medication) need to be weighed in deciding timing of surgery. Let me know if you need more information or to set up a visit with me to discuss with the patient. thanks  -Dr. Fell  Aspirin  should be continued in the perioperative period without interruption unless there is very high risk for bleeding with significant associated morbidity/mortality.  -Dr. Mady  Will route this bundled recommendation to requesting provider via Epic fax function and remove from pre-op pool. Please call with questions.  Orren LOISE Fabry, PA-C 08/13/2024, 4:40 PM

## 2024-08-19 ENCOUNTER — Other Ambulatory Visit: Payer: Self-pay | Admitting: Nurse Practitioner

## 2024-08-20 ENCOUNTER — Other Ambulatory Visit: Payer: Self-pay

## 2024-08-20 ENCOUNTER — Other Ambulatory Visit (HOSPITAL_COMMUNITY): Payer: Self-pay

## 2024-08-20 MED ORDER — CARVEDILOL 6.25 MG PO TABS
6.2500 mg | ORAL_TABLET | Freq: Two times a day (BID) | ORAL | 2 refills | Status: AC
  Filled 2024-08-20: qty 180, 90d supply, fill #0
  Filled 2024-11-22: qty 180, 90d supply, fill #1

## 2024-08-21 NOTE — H&P (Signed)
 PREOPERATIVE H&P  Chief Complaint: Articular cartilage disorder of shoulder, right,Impingement syndrome of right shoulder, Bicipital tendinitis of right shoulder, tear of right rotator cuff, Primary osteoarthritis of right shoulde  HPI: Todd Lynn is a 68 y.o. male who is scheduled for, Procedure(s): ARTHROSCOPY, SHOULDER, WITH ROTATOR CUFF REPAIR.   Patient has a past medical history significant for MI in March 2025.   Patient is an active 68 year-old male who has a history of heavy weightlifting.  He has had pain and burning in his right shoulder.  He has a cystic mass on the anterior part of his shoulder that bothers him as well.  He has tried and failed injections and physical therapy.    Symptoms are rated as moderate to severe, and have been worsening.  This is significantly impairing activities of daily living.    Please see clinic note for further details on this patient's care.    He has elected for surgical management.   Past Medical History:  Diagnosis Date   Arthritis    CHF (congestive heart failure) (HCC)    improved with medications   Hyperlipidemia    Hypertension    Myocardial infarction [facility])    01-22-24  stents   Past Surgical History:  Procedure Laterality Date   CORONARY/GRAFT ACUTE MI REVASCULARIZATION N/A 01/22/2024   Procedure: Coronary/Graft Acute MI Revascularization;  Surgeon: Darron Deatrice LABOR, MD;  Location: [facility] INVASIVE CV LAB;  Service: Cardiovascular;  Laterality: N/A;   HAND SURGERY Right    LEFT HEART CATH AND CORONARY ANGIOGRAPHY N/A 01/22/2024   Procedure: LEFT HEART CATH AND CORONARY ANGIOGRAPHY;  Surgeon: Darron Deatrice LABOR, MD;  Location: [facility] INVASIVE CV LAB;  Service: Cardiovascular;  Laterality: N/A;   NECK SURGERY     Social History   Socioeconomic History   Marital status: Single    Spouse name: Not on file   Number of children: Not on file   Years of education: Not on file   Highest education level: Not on file   Occupational History   Not on file  Tobacco Use   Smoking status: Never   Smokeless tobacco: Former    Types: Chew   Tobacco comments:    Quit 12-2023  Vaping Use   Vaping status: Never Used  Substance and Sexual Activity   Alcohol use: Yes    Comment: occasional   Drug use: No   Sexual activity: Not Currently  Other Topics Concern   Not on file  Social History Narrative   Not on file   Social Drivers of Health   Financial Resource Strain: Not on file  Food Insecurity: No Food Insecurity (01/22/2024)   Hunger Vital Sign    Worried About Running Out of Food in the Last Year: Never true    Ran Out of Food in the Last Year: Never true  Transportation Needs: No Transportation Needs (01/22/2024)   PRAPARE - Administrator, Civil Service (Medical): No    Lack of Transportation (Non-Medical): No  Physical Activity: Not on file  Stress: Not on file  Social Connections: Moderately Isolated (01/22/2024)   Social Connection and Isolation Panel    Frequency of Communication with Friends and Family: More than three times a week    Frequency of Social Gatherings with Friends and Family: Once a week    Attends Religious Services: Never    Database administrator or Organizations: Yes    Attends Engineer, structural: More than  4 times per year    Marital Status: Never married   No family history on file. No Known Allergies Prior to Admission medications   Medication Sig Start Date End Date Taking? Authorizing Provider  aspirin  81 MG chewable tablet Chew 1 tablet (81 mg total) by mouth daily. 01/24/24  Yes Henry Manuelita NOVAK, NP  atorvastatin  (LIPITOR) 80 MG tablet Take 1 tablet (80 mg total) by mouth daily. 07/19/24  Yes Henry Manuelita NOVAK, NP  losartan  (COZAAR ) 25 MG tablet Take 1 tablet (25 mg total) by mouth daily. 07/19/24  Yes Henry Manuelita NOVAK, NP  nitroGLYCERIN  (NITROSTAT ) 0.4 MG SL tablet Place 1 tablet (0.4 mg total) under the tongue every 5 (five) minutes as  needed. 01/23/24  Yes Henry Manuelita B, NP  prasugrel  (EFFIENT ) 10 MG TABS tablet Take 1 tablet (10 mg total) by mouth daily. 01/24/24  Yes Henry Manuelita NOVAK, NP  spironolactone  (ALDACTONE ) 25 MG tablet Take 1 tablet (25 mg total) by mouth daily. Patient taking differently: Take 12.5 mg by mouth daily. 02/01/24  Yes   Blood Pressure Monitoring (BLOOD PRESSURE CUFF) MISC 1 each by Does not apply route daily. 02/01/24   Wyn Jackee VEAR Mickey., NP  carvedilol  (COREG ) 6.25 MG tablet Take 1 tablet (6.25 mg total) by mouth 2 (two) times daily with a meal. 08/20/24   Wonda Sharper, MD    ROS: All other systems have been reviewed and were otherwise negative with the exception of those mentioned in the HPI and as above.  Physical Exam: General: Alert, no acute distress Cardiovascular: No pedal edema Respiratory: No cyanosis, no use of accessory musculature GI: No organomegaly, abdomen is soft and non-tender Skin: No lesions in the area of chief complaint Neurologic: Sensation intact distally Psychiatric: Patient is competent for consent with normal mood and affect Lymphatic: No axillary or cervical lymphadenopathy  MUSCULOSKELETAL:  On exam he has weakness with supraspinatus testing.  Positive AC tenderness to palpation.  Positive impingement.  Positive O'Brien's.  Imaging: MRI demonstrates a full thickness supraspinatus and infraspinatus tear.  Fluid around the biceps.  Large cystic mass around the distal clavicle.  Arthrosis of the distal clavicle with a lateral hanging acromial spur.  Assessment: Articular cartilage disorder of shoulder, right,Impingement syndrome of right shoulder, Bicipital tendinitis of right shoulder, tear of right rotator cuff, Primary osteoarthritis of right shoulde  Plan: Plan for Procedure(s): ARTHROSCOPY, SHOULDER, WITH ROTATOR CUFF REPAIR  The risks benefits and alternatives were discussed with the patient including but not limited to the risks of nonoperative  treatment, versus surgical intervention including infection, bleeding, nerve injury,  blood clots, cardiopulmonary complications, morbidity, mortality, among others, and they were willing to proceed.   The patient acknowledged the explanation, agreed to proceed with the plan and consent was signed.   Operative Plan: arthroscopic rotator cuff repair, biceps tenodesis and subacromial decompression with an open distal clavicle and cyst excision were discussed.  We will likely do the distal clavicle first.  Discharge Medications: standard DVT Prophylaxis: none Physical Therapy: outpatient PT Special Discharge needs: Sling (should bring with him). IceMan   Aleck LOISE Stalling, PA-C  08/21/2024 3:50 PM

## 2024-08-22 ENCOUNTER — Encounter (HOSPITAL_COMMUNITY)
Admission: RE | Disposition: A | Discharge status: Home / Self Care | Payer: Self-pay | Source: Ambulatory Visit | Attending: Orthopaedic Surgery

## 2024-08-22 ENCOUNTER — Encounter (HOSPITAL_COMMUNITY): Payer: Self-pay | Admitting: Orthopaedic Surgery

## 2024-08-22 ENCOUNTER — Other Ambulatory Visit (HOSPITAL_COMMUNITY): Payer: Self-pay

## 2024-08-22 ENCOUNTER — Ambulatory Visit (HOSPITAL_COMMUNITY)
Admission: RE | Admit: 2024-08-22 | Discharge: 2024-08-22 | Disposition: A | Discharge status: Home / Self Care | Source: Ambulatory Visit | Attending: Orthopaedic Surgery | Admitting: Orthopaedic Surgery

## 2024-08-22 ENCOUNTER — Ambulatory Visit (HOSPITAL_COMMUNITY): Payer: Self-pay | Admitting: Physician Assistant

## 2024-08-22 ENCOUNTER — Ambulatory Visit (HOSPITAL_COMMUNITY): Payer: Self-pay | Admitting: Anesthesiology

## 2024-08-22 DIAGNOSIS — I509 Heart failure, unspecified: Secondary | ICD-10-CM | POA: Insufficient documentation

## 2024-08-22 DIAGNOSIS — I252 Old myocardial infarction: Secondary | ICD-10-CM | POA: Diagnosis not present

## 2024-08-22 DIAGNOSIS — X500XXA Overexertion from strenuous movement or load, initial encounter: Secondary | ICD-10-CM | POA: Insufficient documentation

## 2024-08-22 DIAGNOSIS — S46911A Strain of unspecified muscle, fascia and tendon at shoulder and upper arm level, right arm, initial encounter: Secondary | ICD-10-CM | POA: Diagnosis not present

## 2024-08-22 DIAGNOSIS — M75121 Complete rotator cuff tear or rupture of right shoulder, not specified as traumatic: Secondary | ICD-10-CM | POA: Insufficient documentation

## 2024-08-22 DIAGNOSIS — M75111 Incomplete rotator cuff tear or rupture of right shoulder, not specified as traumatic: Secondary | ICD-10-CM | POA: Insufficient documentation

## 2024-08-22 DIAGNOSIS — S43439A Superior glenoid labrum lesion of unspecified shoulder, initial encounter: Secondary | ICD-10-CM | POA: Diagnosis not present

## 2024-08-22 DIAGNOSIS — Y93B3 Activity, free weights: Secondary | ICD-10-CM | POA: Insufficient documentation

## 2024-08-22 DIAGNOSIS — M7521 Bicipital tendinitis, right shoulder: Secondary | ICD-10-CM | POA: Insufficient documentation

## 2024-08-22 DIAGNOSIS — I110 Hypertensive heart disease with heart failure: Secondary | ICD-10-CM

## 2024-08-22 DIAGNOSIS — M75101 Unspecified rotator cuff tear or rupture of right shoulder, not specified as traumatic: Secondary | ICD-10-CM

## 2024-08-22 DIAGNOSIS — M24111 Other articular cartilage disorders, right shoulder: Secondary | ICD-10-CM | POA: Diagnosis not present

## 2024-08-22 DIAGNOSIS — Z79899 Other long term (current) drug therapy: Secondary | ICD-10-CM | POA: Insufficient documentation

## 2024-08-22 DIAGNOSIS — E785 Hyperlipidemia, unspecified: Secondary | ICD-10-CM | POA: Diagnosis not present

## 2024-08-22 DIAGNOSIS — M7541 Impingement syndrome of right shoulder: Secondary | ICD-10-CM | POA: Insufficient documentation

## 2024-08-22 DIAGNOSIS — M19011 Primary osteoarthritis, right shoulder: Secondary | ICD-10-CM | POA: Diagnosis not present

## 2024-08-22 DIAGNOSIS — Z955 Presence of coronary angioplasty implant and graft: Secondary | ICD-10-CM | POA: Insufficient documentation

## 2024-08-22 DIAGNOSIS — G8918 Other acute postprocedural pain: Secondary | ICD-10-CM | POA: Diagnosis not present

## 2024-08-22 HISTORY — PX: SHOULDER ARTHROSCOPY WITH ROTATOR CUFF REPAIR: SHX5685

## 2024-08-22 SURGERY — ARTHROSCOPY, SHOULDER, WITH ROTATOR CUFF REPAIR
Anesthesia: General | Site: Shoulder | Laterality: Right

## 2024-08-22 MED ORDER — ROCURONIUM BROMIDE 100 MG/10ML IV SOLN
INTRAVENOUS | Status: DC | PRN
  Administered 2024-08-22: 60 mg via INTRAVENOUS

## 2024-08-22 MED ORDER — OXYCODONE HCL 5 MG/5ML PO SOLN: 5.0000 mg | Freq: Once | ORAL | Status: DC | PRN

## 2024-08-22 MED ORDER — PROPOFOL 10 MG/ML IV BOLUS
INTRAVENOUS | Status: AC
  Filled 2024-08-22: qty 20

## 2024-08-22 MED ORDER — OXYCODONE HCL 5 MG PO TABS: 5.0000 mg | ORAL_TABLET | Freq: Once | ORAL | Status: DC | PRN

## 2024-08-22 MED ORDER — BUPIVACAINE LIPOSOME 1.3 % IJ SUSP
INTRAMUSCULAR | Status: DC | PRN
Start: 2024-08-22 — End: 2024-08-22
  Administered 2024-08-22: 10 mL via PERINEURAL

## 2024-08-22 MED ORDER — ONDANSETRON HCL 4 MG/2ML IJ SOLN
INTRAMUSCULAR | Status: DC | PRN
  Administered 2024-08-22: 4 mg via INTRAVENOUS

## 2024-08-22 MED ORDER — OXYCODONE HCL 5 MG PO TABS
5.0000 mg | ORAL_TABLET | Freq: Four times a day (QID) | ORAL | 0 refills | Status: DC
  Filled 2024-08-22: qty 30, 7d supply, fill #0

## 2024-08-22 MED ORDER — ORAL CARE MOUTH RINSE: 15.0000 mL | Freq: Once | OROMUCOSAL | Status: AC

## 2024-08-22 MED ORDER — FENTANYL CITRATE (PF) 100 MCG/2ML IJ SOLN
INTRAMUSCULAR | Status: DC | PRN
  Administered 2024-08-22: 50 ug via INTRAVENOUS

## 2024-08-22 MED ORDER — SODIUM CHLORIDE 0.9 % IR SOLN
Status: DC | PRN
  Administered 2024-08-22: 3000 mL

## 2024-08-22 MED ORDER — LIDOCAINE HCL (CARDIAC) PF 100 MG/5ML IV SOSY
PREFILLED_SYRINGE | INTRAVENOUS | Status: DC | PRN
Start: 2024-08-22 — End: 2024-08-22
  Administered 2024-08-22: 20 mg via INTRAVENOUS

## 2024-08-22 MED ORDER — CEFAZOLIN SODIUM-DEXTROSE 2-4 GM/100ML-% IV SOLN
2.0000 g | INTRAVENOUS | Status: AC
  Administered 2024-08-22: 2 g via INTRAVENOUS
  Filled 2024-08-22: qty 100

## 2024-08-22 MED ORDER — CHLORHEXIDINE GLUCONATE 0.12 % MT SOLN
15.0000 mL | Freq: Once | OROMUCOSAL | Status: AC
  Administered 2024-08-22: 15 mL via OROMUCOSAL

## 2024-08-22 MED ORDER — ACETAMINOPHEN 500 MG PO TABS
1000.0000 mg | ORAL_TABLET | Freq: Three times a day (TID) | ORAL | 0 refills | Status: AC
  Filled 2024-08-22: qty 84, 14d supply, fill #0

## 2024-08-22 MED ORDER — PHENYLEPHRINE HCL-NACL 20-0.9 MG/250ML-% IV SOLN
INTRAVENOUS | Status: AC
  Filled 2024-08-22: qty 250

## 2024-08-22 MED ORDER — MIDAZOLAM HCL 5 MG/5ML IJ SOLN
INTRAMUSCULAR | Status: DC | PRN
Start: 2024-08-22 — End: 2024-08-22
  Administered 2024-08-22: 2 mg via INTRAVENOUS

## 2024-08-22 MED ORDER — PHENYLEPHRINE HCL-NACL 20-0.9 MG/250ML-% IV SOLN
INTRAVENOUS | Status: DC | PRN
  Administered 2024-08-22: 25 ug/min via INTRAVENOUS

## 2024-08-22 MED ORDER — EPINEPHRINE PF 1 MG/ML IJ SOLN
INTRAMUSCULAR | Status: AC
  Filled 2024-08-22: qty 4

## 2024-08-22 MED ORDER — MIDAZOLAM HCL 2 MG/2ML IJ SOLN
INTRAMUSCULAR | Status: AC
  Filled 2024-08-22: qty 2

## 2024-08-22 MED ORDER — FENTANYL CITRATE (PF) 50 MCG/ML IJ SOSY: 25.0000 ug | PREFILLED_SYRINGE | INTRAMUSCULAR | Status: DC | PRN

## 2024-08-22 MED ORDER — GABAPENTIN 100 MG PO CAPS
100.0000 mg | ORAL_CAPSULE | Freq: Three times a day (TID) | ORAL | 0 refills | Status: DC
  Filled 2024-08-22: qty 90, 30d supply, fill #0

## 2024-08-22 MED ORDER — SUGAMMADEX SODIUM 200 MG/2ML IV SOLN
INTRAVENOUS | Status: DC | PRN
  Administered 2024-08-22: 250 mg via INTRAVENOUS

## 2024-08-22 MED ORDER — ACETAMINOPHEN 500 MG PO TABS
1000.0000 mg | ORAL_TABLET | Freq: Once | ORAL | Status: AC
  Administered 2024-08-22: 1000 mg via ORAL
  Filled 2024-08-22: qty 2

## 2024-08-22 MED ORDER — SODIUM CHLORIDE 0.9 % IR SOLN
Status: DC | PRN
  Administered 2024-08-22: 1000 mL

## 2024-08-22 MED ORDER — LACTATED RINGERS IV SOLN: INTRAVENOUS | Status: DC | PRN

## 2024-08-22 MED ORDER — DEXAMETHASONE SOD PHOSPHATE PF 10 MG/ML IJ SOLN
INTRAMUSCULAR | Status: DC | PRN
Start: 2024-08-22 — End: 2024-08-22
  Administered 2024-08-22: 8 mg via INTRAVENOUS

## 2024-08-22 MED ORDER — PHENYLEPHRINE HCL (PRESSORS) 10 MG/ML IV SOLN
INTRAVENOUS | Status: AC
  Filled 2024-08-22: qty 1

## 2024-08-22 MED ORDER — AMISULPRIDE (ANTIEMETIC) 5 MG/2ML IV SOLN: 10.0000 mg | Freq: Once | INTRAVENOUS | Status: DC | PRN

## 2024-08-22 MED ORDER — LACTATED RINGERS IV SOLN: INTRAVENOUS | Status: DC

## 2024-08-22 MED ORDER — ONDANSETRON HCL 4 MG PO TABS
4.0000 mg | ORAL_TABLET | Freq: Three times a day (TID) | ORAL | 0 refills | Status: AC | PRN
  Filled 2024-08-22: qty 10, 4d supply, fill #0

## 2024-08-22 MED ORDER — SUGAMMADEX SODIUM 200 MG/2ML IV SOLN
INTRAVENOUS | Status: AC
  Filled 2024-08-22: qty 2

## 2024-08-22 MED ORDER — BUPIVACAINE HCL (PF) 0.5 % IJ SOLN
INTRAMUSCULAR | Status: DC | PRN
  Administered 2024-08-22: 10 mL via PERINEURAL

## 2024-08-22 MED ORDER — PROPOFOL 10 MG/ML IV BOLUS
INTRAVENOUS | Status: DC | PRN
  Administered 2024-08-22: 200 mg via INTRAVENOUS

## 2024-08-22 MED ORDER — FENTANYL CITRATE (PF) 100 MCG/2ML IJ SOLN
INTRAMUSCULAR | Status: AC
  Filled 2024-08-22: qty 2

## 2024-08-22 MED ORDER — TRANEXAMIC ACID-NACL 1000-0.7 MG/100ML-% IV SOLN
1000.0000 mg | INTRAVENOUS | Status: AC
  Administered 2024-08-22: 1000 mg via INTRAVENOUS
  Filled 2024-08-22: qty 100

## 2024-08-22 SURGICAL SUPPLY — 57 items
ANCHOR FBRTK 2.6 SUTURETAP 1.3 (Anchor) IMPLANT
ANCHOR SUT 1.8 FIBERTAK SB KL (Anchor) IMPLANT
ANCHOR SUT BIO SW 4.75X19.1 (Anchor) IMPLANT
BAG COUNTER SPONGE SURGICOUNT (BAG) ×1 IMPLANT
BLADE EXCALIBUR 4.0X13 (MISCELLANEOUS) ×1 IMPLANT
BLADE SHAVER BONE 5.0X13 (MISCELLANEOUS) IMPLANT
BLADE SURG SZ10 CARB STEEL (BLADE) IMPLANT
BURR OVAL 8 FLU 4.0X13 (MISCELLANEOUS) ×1 IMPLANT
CANNULA 5.75X71 LONG (CANNULA) IMPLANT
CANNULA PASSPORT 5 (CANNULA) IMPLANT
CANNULA TWIST IN 8.25X7CM (CANNULA) IMPLANT
CHLORAPREP W/TINT 26 (MISCELLANEOUS) ×1 IMPLANT
CLSR STERI-STRIP ANTIMIC 1/2X4 (GAUZE/BANDAGES/DRESSINGS) ×1 IMPLANT
COOLER ICEMAN CLASSIC (MISCELLANEOUS) ×1 IMPLANT
DISSECTOR 3.5MM X 13CM CVD (MISCELLANEOUS) IMPLANT
DISSECTOR 4.0MMX13CM CVD (MISCELLANEOUS) IMPLANT
DRAPE IMP U-DRAPE 54X76 (DRAPES) ×1 IMPLANT
DRAPE SHEET LG 3/4 BI-LAMINATE (DRAPES) ×1 IMPLANT
DRAPE SHOULDER BEACH CHAIR (DRAPES) ×1 IMPLANT
DW OUTFLOW CASSETTE/TUBE SET (MISCELLANEOUS) ×1 IMPLANT
ELECT NDL TIP 2.8 STRL (NEEDLE) IMPLANT
ELECT NEEDLE TIP 2.8 STRL (NEEDLE) IMPLANT
ELECT PENCIL ROCKER SW 15FT (MISCELLANEOUS) IMPLANT
ELECT REM PT RETURN 15FT ADLT (MISCELLANEOUS) IMPLANT
GAUZE PAD ABD 8X10 STRL (GAUZE/BANDAGES/DRESSINGS) ×3 IMPLANT
GAUZE SPONGE 4X4 12PLY STRL (GAUZE/BANDAGES/DRESSINGS) ×1 IMPLANT
GLOVE BIO SURGEON STRL SZ 6.5 (GLOVE) ×1 IMPLANT
GLOVE BIOGEL PI IND STRL 6.5 (GLOVE) ×1 IMPLANT
GLOVE BIOGEL PI IND STRL 8 (GLOVE) ×1 IMPLANT
GLOVE ECLIPSE 8.0 STRL XLNG CF (GLOVE) ×1 IMPLANT
GOWN STRL REUS W/ TWL LRG LVL3 (GOWN DISPOSABLE) ×2 IMPLANT
KIT ANCHOR FBRTK 2.6 STR (KITS) IMPLANT
KIT BASIN OR (CUSTOM PROCEDURE TRAY) ×1 IMPLANT
KIT STABILIZATION SHOULDER (MISCELLANEOUS) ×1 IMPLANT
KIT STR SPEAR 1.8 FBRTK DISP (KITS) IMPLANT
KIT TURNOVER KIT A (KITS) ×1 IMPLANT
LASSO CRESCENT QUICKPASS (SUTURE) IMPLANT
MANIFOLD NEPTUNE II (INSTRUMENTS) ×1 IMPLANT
NDL HD SCORPION MEGA LOADER (NEEDLE) IMPLANT
NDL TAPERED W/ NITINOL LOOP (MISCELLANEOUS) IMPLANT
NEEDLE TAPERED W/ NITINOL LOOP (MISCELLANEOUS) IMPLANT
PACK ARTHROSCOPY WL (CUSTOM PROCEDURE TRAY) ×1 IMPLANT
PAD COLD SHLDR WRAP-ON (PAD) IMPLANT
RESTRAINT HEAD UNIVERSAL NS (MISCELLANEOUS) ×1 IMPLANT
SPIKE FLUID TRANSFER (MISCELLANEOUS) IMPLANT
SPONGE T-LAP 4X18 ~~LOC~~+RFID (SPONGE) IMPLANT
SUT MNCRL AB 4-0 PS2 18 (SUTURE) ×1 IMPLANT
SUT PDS AB 1 CT 36 (SUTURE) IMPLANT
SUT TIGER TAPE 7 IN WHITE (SUTURE) IMPLANT
SUTURE FIBERWR #2 38 T-5 BLUE (SUTURE) IMPLANT
SUTURE TAPE TIGERLINK 1.3MM BL (SUTURE) IMPLANT
SYR 10ML LL (SYRINGE) IMPLANT
TAPE FIBER 2MM 7IN #2 BLUE (SUTURE) IMPLANT
TOWEL OR 17X26 10 PK STRL BLUE (TOWEL DISPOSABLE) ×1 IMPLANT
TUBE SUCTION HIGH CAP CLEAR NV (SUCTIONS) IMPLANT
TUBING ARTHROSCOPY IRRIG 16FT (MISCELLANEOUS) ×1 IMPLANT
WAND ABLATOR APOLLO I90 (BUR) ×1 IMPLANT

## 2024-08-22 NOTE — Anesthesia Procedure Notes (Signed)
 Anesthesia Regional Block: Interscalene brachial plexus block   Pre-Anesthetic Checklist: , timeout performed,  Correct Patient, Correct Site, Correct Laterality,  Correct Procedure, Correct Position, site marked,  Risks and benefits discussed,  Surgical consent,  Pre-op evaluation,  At surgeon's request and post-op pain management  Laterality: Right  Prep: chloraprep       Needles:  Injection technique: Single-shot  Needle Type: Echogenic Stimulator Needle     Needle Length: 9cm  Needle Gauge: 21     Additional Needles:   Procedures:,,,, ultrasound used (permanent image in chart),,    Narrative:  Start time: 08/22/2024 6:55 AM End time: 08/22/2024 7:00 AM Injection made incrementally with aspirations every 5 mL.  Performed by: Personally  Anesthesiologist: Peggye Delon Brunswick, MD  Additional Notes: Discussed risks and benefits of nerve block including, but not limited to, prolonged and/or permanent nerve injury involving sensory and/or motor function. Monitors were applied and a time-out was performed. The nerve and associated structures were visualized under ultrasound guidance. After negative aspiration, local anesthetic was slowly injected around the nerve. There was no evidence of high pressure during the procedure. There were no paresthesias. VSS remained stable and the patient tolerated the procedure well.

## 2024-08-22 NOTE — Interval H&P Note (Signed)
 All questions answered, patient wants to proceed with procedure. ? ?

## 2024-08-22 NOTE — Discharge Instructions (Signed)
 Bonner Hair MD, MPH Aleck Stalling, PA-C [facility] [address], [address] [phone] (tel)   [phone] (fax)   POST-OPERATIVE INSTRUCTIONS - SHOULDER ARTHROSCOPY  WOUND CARE You may remove the Operative Dressing on Post-Op Day #3 (72hrs after surgery).   Alternatively if you would like you can leave dressing on until follow-up if within 7-8 days but keep it dry. Leave steri-strips in place until they fall off on their own, usually 2 weeks postop. There may be a small amount of fluid/bleeding leaking at the surgical site.  This is normal; the shoulder is filled with fluid during the procedure and can leak for 24-48hrs after surgery.  You may change/reinforce the bandage as needed.  Use the Cryocuff or Ice as often as possible for the first 7 days, then as needed for pain relief. Always keep a towel, ACE wrap or other barrier between the cooling unit and your skin.  You may shower on Post-Op Day #3. Gently pat the area dry.  Do not soak the shoulder in water or submerge it.  Keep incisions as dry as possible. Do not go swimming in the pool or ocean until 4 weeks after surgery or when otherwise instructed.    EXERCISES Wear the sling at all times  You may remove the sling for showering, but keep the arm across the chest or in a secondary sling.     It is normal for your fingers/hand to become more swollen after surgery and discolored from bruising.   This will resolve over the first few weeks usually after surgery. Please continue to ambulate and do not stay sitting or lying for too long.  Perform foot and wrist pumps to assist in circulation.  PHYSICAL THERAPY - You will begin physical therapy soon after surgery (unless otherwise specified) - Please call to set up an appointment, if you do not already have one  - Let our office if there are any issues with scheduling your therapy  - You have a physical therapy appointment scheduled at [facility] (across  the hall from our office) on 10/24   REGIONAL ANESTHESIA (NERVE BLOCKS) The anesthesia team may have performed a nerve block for you this is a great tool used to minimize pain.   The block may start wearing off overnight (between 8-24 hours postop) When the block wears off, your pain may go from nearly zero to the pain you would have had postop without the block. This is an abrupt transition but nothing dangerous is happening.   This can be a challenging period but utilize your as needed pain medications to try and manage this period. We suggest you use the pain medication the first night prior to going to bed, to ease this transition.  You may take an extra dose of narcotic when this happens if needed  POST-OP MEDICATIONS- Multimodal approach to pain control In general your pain will be controlled with a combination of substances.  Prescriptions unless otherwise discussed are electronically sent to your pharmacy.  This is a carefully made plan we use to minimize narcotic use.     Gabapentin - this is to help with nerve based pain taken on a scheduled basis Acetaminophen  - Non-narcotic pain medicine taken on a scheduled basis  Oxycodone - This is a strong narcotic, to be used only on an "as needed" basis for SEVERE pain. Zofran  - take as needed for nausea   FOLLOW-UP If you develop a Fever (>=101.5), Redness or Drainage from  the surgical incision site, please call our office to arrange for an evaluation. Please call the office to schedule a follow-up appointment for your first post-operative appointment, 7-10 days post-operatively.    HELPFUL INFORMATION   You may be more comfortable sleeping in a semi-seated position the first few nights following surgery.  Keep a pillow propped under the elbow and forearm for comfort.  If you have a recliner type of chair it might be beneficial.  If not that is fine too, but it would be helpful to sleep propped up with pillows behind your operated  shoulder as well under your elbow and forearm.  This will reduce pulling on the suture lines.  When dressing, put your operative arm in the sleeve first.  When getting undressed, take your operative arm out last.  Loose fitting, button-down shirts are recommended.  Often in the first days after surgery you may be more comfortable keeping your operative arm under your shirt and not through the sleeve.  You may return to work/school in the next couple of days when you feel up to it.  Desk work and typing in the sling is fine.  We suggest you use the pain medication the first night prior to going to bed, in order to ease any pain when the anesthesia wears off. You should avoid taking pain medications on an empty stomach as it will make you nauseous.  You should wean off your narcotic medicines as soon as you are able.  Most patients will be off narcotics before their first postop appointment.   Do not drink alcoholic beverages or take illicit drugs when taking pain medications.  It is against the law to drive while taking narcotics.  In some states it is against the law to drive while your arm is in a sling.   Pain medication may make you constipated.  Below are a few solutions to try in this order: Decrease the amount of pain medication if you aren't having pain. Drink lots of decaffeinated fluids. Drink prune juice and/or eat dried prunes  If the first 3 don't work start with additional solutions Take Colace - an over-the-counter stool softener Take Senokot - an over-the-counter laxative Take Miralax - a stronger over-the-counter laxative  For more information including helpful videos and documents visit our website:   [email or web address]

## 2024-08-22 NOTE — Op Note (Signed)
 Orthopaedic Surgery Operative Note (CSN: [id number])  Todd Lynn  09-14-1956 Date of Surgery: 08/22/2024   DIAGNOSES: Right shoulder, acute on chronic rotator cuff tear, SLAP tear, biceps tendinitis, AC arthritis, and subacromial impingement.  Early glenohumeral arthritis  POST-OPERATIVE DIAGNOSIS: same  PROCEDURE: Arthroscopic extensive debridement - 29823 Subdeltoid Bursa, Supraspinatus Tendon, Anterior Labrum, Superior Labrum, Posterior Labrum, and Humeral bone and humeral cartilage glenoid bone and glenoid cartilage Arthroscopic distal clavicle excision - 70175 Arthroscopic subacromial decompression - 70173 Arthroscopic rotator cuff repair - 70172 Arthroscopic biceps tenodesis - 70171   OPERATIVE FINDING: Exam under anesthesia: Normal Articular space: Anterior posterior labral tearing was noted, thickening of the [facility] Chond[facility] surfaces: Grade 1 and 2 changes on the humerus and the glenoid.  This was debrided back to stable bases.  No loose bodies noted. Biceps: Intrasubstance tearing and a type II SLAP tear. Subscapularis: Incomplete tear upper border 5 to 10% tear debrided back to stable base, no indication for repair. Supraspinatus: Complete tear full-thickness tear, one by one anchor configuration for repair Infraspinatus: Intact    I had initially planned on an open distal clavicle to excise a cyst however I felt that this may destabilize the clavicle secondary to the state of his AC ligaments.  Instead I felt that decorticating from the underside and attempting to open the cyst from the anterior section may provide a better relief.  If the patient has continued symptoms we may have to do a secondary cyst excision however I do not think that this will be necessary and the risk of destabilizing his distal clavicle was too high doing this all at once.  Post-operative plan: The patient will be non-weightbearing in a sling.  The patient will be discharged home.  DVT prophylaxis  not indicated in ambulatory upper extremity patient without known risk factors.   Pain control with PRN pain medication preferring oral medicines.  Follow up plan will be scheduled in approximately 7 days for incision check and XR.  Surgeons:Primary: Cristy Bonner DASEN, MD Assistants:Caroline McBane, PA-C [facility] ROOM 06 Anesthesia: General with Exparel interscalene block Antibiotics: Ancef 2 g Tourniquet time: None Estimated Blood Loss: Minimal Complications: None Specimens: None Implants: Implant Name Type Inv. Item Serial No. Manufacturer Lot No. LRB No. Used Action  ANCHOR SUT 1.8 FIBERTAK SB KL - [id number] Anchor ANCHOR SUT 1.8 FIBERTAK SB KL  [employer] [id number] Right 1 Implanted  ANCHOR SUT 1.8 FIBERTAK SB KL - [id number] Anchor ANCHOR SUT 1.8 FIBERTAK SB KL  [employer] [id number] Right 1 Implanted  ANCHOR SUT BIO SW 4.75X19.1 - [id number] Anchor ANCHOR SUT BIO SW 4.75X19.1  [employer] [id number] Right 1 Implanted  ANCHOR FBRTK 2.6 SUTURETAP 1.3 - [id number] Anchor ANCHOR FBRTK 2.6 SUTURETAP 1.3  [employer] [id number] Right 1 Implanted    Indications for Surgery:   Todd Lynn is a 68 y.o. male with continued shoulder pain refractory to nonoperative measures for extended period of time.    The risks and benefits were explained at length including but not limited to continued pain, cuff failure, biceps tenodesis failure, stiffness, need for further surgery and infection.   Procedure:   Patient was correctly identified in the preoperative holding area and operative site marked.  Patient brought to OR and positioned beachchair on an [place] table ensuring that all bony prominences were padded and the head was in an appropriate location.  Anesthesia was induced and the operative shoulder was prepped and draped in the usual sterile fashion.  Timeout was called preincision.  A standard posterior viewing portal was made after localizing the portal with a spinal needle.  An anterior  accessory portal was also made.  After clearing the articular space the camera was positioned in the subacromial space.  Findings above.    Extensive debridement was performed of the anterior interval tissue, labral fraying and the bursa.  Glenoid bone, glenoid cartilage, humeral bone were all debrided.  Subacromial decompression: We made a lateral portal with spinal needle guidance. We then proceeded to debride bursal tissue extensively with a shaver and arthrocare device. At that point we continued to identify the borders of the acromion and identify the spur. We then carefully preserved the deltoid fascia and used a burr to convert the acromion to a Type 1 flat acromion without issue.  Arthroscopic rotator cuff repair: Once the above is complete we used a shaver as well as a bur to prepare the tuberosity and clear soft tissue so that there was bony bleeding and appropriate bloody flecks for healing.  We then placed 2 Medial Row 2.6 mm fiber tack anchors with knotless mechanisms and tape.  We used a scorpion as well as a link suture to pass all of the sutures from each anchor through the cuff.  We then were able to use the knotless mechanism sutures to perform a double medial row tiedown compressing the medial row without overtensioning. Once this was complete we cut the switch sutures and performed a speed bridge type repair pulling the tapes over to 2 lateral row 4.75 mm bio composite swivel locks.  This provided compression of the cuff to the tuberosity.  Biceps tenodesis: We marked the tendon and then performed a tenotomy and debridement of the stump in the articular space. We then identified the biceps tendon in its groove suprapec with the arthroscope in the lateral portal taking care to move from lateral to medial to avoid injury to the subscapularis. At that point we unroofed the tendon itself and mobilized it. An accessory anterior portal was made in line with the tendon and we grasped it from the  anterior superior portal and worked from the accessory anterior portal. Two Fibertak 1.33mm knotless anchors were placed in the groove and the tendon was secured in a luggage loop style fashion with a pass of the limb of suture through the tendon using a scorpion device to avoid pull-through.  Repair was completed with good tension on the tendon.  Residual stump of the tendon was removed after being resected with a RF ablator.  Distal Clavicle resection:  The scope was placed in the subacromial space from the posterior portal.  A hemostat was placed through the anterior portal and we spread at the [facility] joint.  A burr was then inserted and 10 mm of distal clavicle was resected taking care to avoid damage to the capsule around the joint and avoiding overhanging bone posteriorly.    The incisions were closed with absorbable monocryl and steri strips.  A sterile dressing was placed along with a sling. The patient was awoken from general anesthesia and taken to the PACU in stable condition without complication.   Aleck Stalling, PA-C, present and scrubbed throughout the case, critical for completion in a timely fashion, and for retraction, instrumentation, closure.

## 2024-08-22 NOTE — Transfer of Care (Signed)
 Immediate Anesthesia Transfer of Care Note  Patient: Todd Lynn  Procedure(s) Performed: ARTHROSCOPY, SHOULDER, WITH ROTATOR CUFF REPAIR (Right: Shoulder)  Patient Location: PACU  Anesthesia Type:General  Level of Consciousness: awake and alert   Airway & Oxygen Therapy: Patient Spontanous Breathing and Patient connected to face mask oxygen  Post-op Assessment: Report given to RN and Post -op Vital signs reviewed and stable  Post vital signs: Reviewed and stable  Last Vitals:  Vitals Value Taken Time  BP 128/72 08/22/24 08:48  Temp    Pulse 48 08/22/24 08:50  Resp 15 08/22/24 08:50  SpO2 99 % 08/22/24 08:50  Vitals shown include unfiled device data.  Last Pain:  Vitals:   08/22/24 0600  TempSrc: Oral  PainSc:          Complications: No notable events documented.

## 2024-08-22 NOTE — Anesthesia Procedure Notes (Signed)
 Procedure Name: Intubation Date/Time: 08/22/2024 7:38 AM  Performed by: Dartha Meckel, CRNAPre-anesthesia Checklist: Patient identified, Emergency Drugs available, Suction available and Patient being monitored Patient Re-evaluated:Patient Re-evaluated prior to induction Oxygen Delivery Method: Circle system utilized Preoxygenation: Pre-oxygenation with 100% oxygen Induction Type: IV induction Ventilation: Mask ventilation without difficulty Laryngoscope Size: Mac and 4 Grade View: Grade I Tube type: Oral Tube size: 7.5 mm Number of attempts: 1 Airway Equipment and Method: Stylet and Oral airway Placement Confirmation: ETT inserted through vocal cords under direct vision, positive ETCO2 and breath sounds checked- equal and bilateral Secured at: 24 cm Tube secured with: Tape Dental Injury: Teeth and Oropharynx as per pre-operative assessment

## 2024-08-22 NOTE — Anesthesia Postprocedure Evaluation (Signed)
 Anesthesia Post Note  Patient: Todd Lynn  Procedure(s) Performed: ARTHROSCOPY, SHOULDER, WITH ROTATOR CUFF REPAIR (Right: Shoulder)     Patient location during evaluation: PACU Anesthesia Type: General Level of consciousness: awake Pain management: pain level controlled Vital Signs Assessment: post-procedure vital signs reviewed and stable Respiratory status: spontaneous breathing, nonlabored ventilation and respiratory function stable Cardiovascular status: blood pressure returned to baseline and stable Postop Assessment: no apparent nausea or vomiting Anesthetic complications: no   No notable events documented.  Last Vitals:  Vitals:   08/22/24 0919 08/22/24 0930  BP: (!) 156/94 (!) 157/99  Pulse: (!) 55 (!) 52  Resp: 20 18  Temp: (!) 36.3 C 36.5 C  SpO2: 95% 95%    Last Pain:  Vitals:   08/22/24 1000  TempSrc:   PainSc: 0-No pain                 Delon Aisha Arch

## 2024-08-23 ENCOUNTER — Encounter (HOSPITAL_COMMUNITY): Payer: Self-pay | Admitting: Orthopaedic Surgery

## 2024-08-24 DIAGNOSIS — M25611 Stiffness of right shoulder, not elsewhere classified: Secondary | ICD-10-CM | POA: Diagnosis not present

## 2024-08-24 DIAGNOSIS — S46011D Strain of muscle(s) and tendon(s) of the rotator cuff of right shoulder, subsequent encounter: Secondary | ICD-10-CM | POA: Diagnosis not present

## 2024-08-24 DIAGNOSIS — M6281 Muscle weakness (generalized): Secondary | ICD-10-CM | POA: Diagnosis not present

## 2024-08-29 DIAGNOSIS — M25611 Stiffness of right shoulder, not elsewhere classified: Secondary | ICD-10-CM | POA: Diagnosis not present

## 2024-08-29 DIAGNOSIS — M6281 Muscle weakness (generalized): Secondary | ICD-10-CM | POA: Diagnosis not present

## 2024-08-29 DIAGNOSIS — S46011D Strain of muscle(s) and tendon(s) of the rotator cuff of right shoulder, subsequent encounter: Secondary | ICD-10-CM | POA: Diagnosis not present

## 2024-09-25 ENCOUNTER — Other Ambulatory Visit: Payer: Self-pay

## 2024-09-26 ENCOUNTER — Encounter (HOSPITAL_COMMUNITY): Payer: Self-pay

## 2024-09-26 ENCOUNTER — Ambulatory Visit (HOSPITAL_COMMUNITY): Admission: EM | Admit: 2024-09-26 | Discharge: 2024-09-26 | Disposition: A | Discharge status: Home / Self Care

## 2024-09-26 DIAGNOSIS — I10 Essential (primary) hypertension: Secondary | ICD-10-CM | POA: Diagnosis not present

## 2024-09-26 DIAGNOSIS — J029 Acute pharyngitis, unspecified: Secondary | ICD-10-CM | POA: Diagnosis not present

## 2024-09-26 LAB — POC COVID19/FLU A&B COMBO
Covid Antigen, POC: NEGATIVE
Influenza A Antigen, POC: NEGATIVE
Influenza B Antigen, POC: NEGATIVE

## 2024-09-26 LAB — POCT RAPID STREP A (OFFICE): Rapid Strep A Screen: NEGATIVE

## 2024-09-26 NOTE — Discharge Instructions (Addendum)
 Your strep, COVID and flu tests were negative today. Get plenty of rest.  Increase fluids.  Salt water gargles for sore throat.  Nasal saline spray may be beneficial in relieving nasal congestion.  Tylenol /acetaminophen  and/or Advil /ibuprofen  for fever, pain. Your blood pressure improved while here, but remains elevated.  Take your blood pressure medications as prescribed.   If you develop any new or worsening symptoms or if your symptoms do not start to improve, please return here or follow-up with your primary care provider.  If your symptoms are severe, please go to the emergency room.

## 2024-09-26 NOTE — ED Provider Notes (Signed)
 [facility]    CSN: [id number] Arrival date & time: 09/26/24  0802      History   Chief Complaint Chief Complaint  Patient presents with   Sore Throat    HPI Todd Lynn is a 68 y.o. male.   This 68 year old male is being seen for complaints of sore throat for 3 days.  He reports painful swallowing.  He reports nasal congestion and drainage as well as cough that is occasionally productive.  He denies fever or chills.  He denies chest pain, shortness of breath.  He denies abdominal pain, nausea, vomiting, skin rashes.   Sore Throat Pertinent negatives include no chest pain, no abdominal pain and no shortness of breath.    Past Medical History:  Diagnosis Date   Arthritis    CHF (congestive heart failure) (HCC)    improved with medications   Hyperlipidemia    Hypertension    Myocardial infarction [facility])    01-22-24  stents    Patient Active Problem List   Diagnosis Date Noted   Hyperlipidemia 01/23/2024   Ischemic cardiomyopathy 01/23/2024   Acute ST elevation myocardial infarction (STEMI) of anterior wall (HCC) 01/22/2024   Overweight 05/14/2014    Past Surgical History:  Procedure Laterality Date   CORONARY/GRAFT ACUTE MI REVASCULARIZATION N/A 01/22/2024   Procedure: Coronary/Graft Acute MI Revascularization;  Surgeon: Darron Deatrice LABOR, MD;  [facility]: [facility];  Service: Cardiovascular;  Laterality: N/A;   HAND SURGERY Right    LEFT HEART CATH AND CORONARY ANGIOGRAPHY N/A 01/22/2024   Procedure: LEFT HEART CATH AND CORONARY ANGIOGRAPHY;  Surgeon: Darron Deatrice LABOR, MD;  Location: [facility];  Service: Cardiovascular;  Laterality: N/A;   NECK SURGERY     SHOULDER ARTHROSCOPY WITH ROTATOR CUFF REPAIR Right 08/22/2024   Procedure: ARTHROSCOPY, SHOULDER, WITH ROTATOR CUFF REPAIR;  Surgeon: Cristy Bonner DASEN, MD;  Location: [facility];  Service: Orthopedics;  Laterality: Right;       Home Medications    Prior to Admission medications    Medication Sig Start Date End Date Taking? Authorizing Provider  aspirin  81 MG chewable tablet Chew 1 tablet (81 mg total) by mouth daily. 01/24/24   Henry Manuelita NOVAK, NP  atorvastatin  (LIPITOR) 80 MG tablet Take 1 tablet (80 mg total) by mouth daily. 07/19/24   Henry Manuelita NOVAK, NP  Blood Pressure Monitoring (BLOOD PRESSURE CUFF) MISC 1 each by Does not apply route daily. 02/01/24   Wyn Jackee VEAR Mickey., NP  carvedilol  (COREG ) 6.25 MG tablet Take 1 tablet (6.25 mg total) by mouth 2 (two) times daily with a meal. 08/20/24   Wonda Sharper, MD  gabapentin  (NEURONTIN ) 100 MG capsule Take 1 capsule (100 mg total) by mouth 3 (three) times daily. For pain 08/22/24   Jennye Aleck SAILOR, PA-C  losartan  (COZAAR ) 25 MG tablet Take 1 tablet (25 mg total) by mouth daily. 07/19/24   Henry Manuelita NOVAK, NP  nitroGLYCERIN  (NITROSTAT ) 0.4 MG SL tablet Place 1 tablet (0.4 mg total) under the tongue every 5 (five) minutes as needed. 01/23/24   Henry Manuelita NOVAK, NP  oxyCODONE  (OXY IR/ROXICODONE ) 5 MG immediate release tablet Take 1 tablet (5 mg total) by mouth every 6 (six) hours for severe pain after surgery 08/22/24     prasugrel  (EFFIENT ) 10 MG TABS tablet Take 1 tablet (10 mg total) by mouth daily. 01/24/24   Henry Manuelita NOVAK, NP  spironolactone  (ALDACTONE ) 25 MG tablet Take 1 tablet (25 mg total) by  mouth daily. Patient taking differently: Take 12.5 mg by mouth daily. 02/01/24       Family History History reviewed. No pertinent family history.  Social History Social History   Tobacco Use   Smoking status: Never   Smokeless tobacco: Former    Types: Chew   Tobacco comments:    Quit 12-2023  Vaping Use   Vaping status: Never Used  Substance Use Topics   Alcohol use: Yes    Comment: occasional   Drug use: No     Allergies   Patient has no known allergies.   Review of Systems Review of Systems  Constitutional:  Negative for chills and fever.  HENT:  Positive for congestion, rhinorrhea, sore  throat and trouble swallowing. Negative for ear pain.   Respiratory:  Positive for cough. Negative for shortness of breath.   Cardiovascular:  Negative for chest pain.  Gastrointestinal:  Negative for abdominal pain, nausea and vomiting.  Skin:  Negative for color change and rash.  All other systems reviewed and are negative.    Physical Exam Triage Vital Signs ED Triage Vitals  Encounter Vitals Group     BP 09/26/24 0823 (!) 150/102     Girls Systolic BP Percentile --      Girls Diastolic BP Percentile --      Boys Systolic BP Percentile --      Boys Diastolic BP Percentile --      Pulse Rate 09/26/24 0823 74     Resp 09/26/24 0823 18     Temp 09/26/24 0823 98.7 F (37.1 C)     Temp Source 09/26/24 0823 Oral     SpO2 09/26/24 0823 96 %     Weight --      Height --      Head Circumference --      Peak Flow --      Pain Score 09/26/24 0821 9     Pain Loc --      Pain Education --      Exclude from Growth Chart --    No data found.  Updated Vital Signs BP (!) 134/90 (BP Location: Left Arm)   Pulse 72   Temp 98.7 F (37.1 C) (Oral)   Resp 18   SpO2 94%   Visual Acuity Right Eye Distance:   Left Eye Distance:   Bilateral Distance:    Right Eye Near:   Left Eye Near:    Bilateral Near:     Physical Exam Vitals and nursing note reviewed.  Constitutional:      General: He is not in acute distress.    Appearance: He is well-developed. He is not toxic-appearing.     Comments: Pleasant male appearing stated age found sitting in chair in no acute distress.  HENT:     Head: Normocephalic and atraumatic.     Mouth/Throat:     Mouth: Mucous membranes are moist.     Pharynx: No oropharyngeal exudate.     Tonsils: No tonsillar exudate.  Eyes:     Conjunctiva/sclera: Conjunctivae normal.  Cardiovascular:     Rate and Rhythm: Normal rate and regular rhythm.     Heart sounds: Normal heart sounds.  Pulmonary:     Effort: Pulmonary effort is normal. No respiratory  distress.     Breath sounds: Normal breath sounds.  Musculoskeletal:        General: No swelling.     Cervical back: Neck supple.  Skin:    General: Skin is  warm and dry.     Capillary Refill: Capillary refill takes less than 2 seconds.  Neurological:     Mental Status: He is alert.  Psychiatric:        Mood and Affect: Mood normal.      UC Treatments / Results  Labs (all labs ordered are listed, but only abnormal results are displayed) Labs Reviewed  POCT RAPID STREP A (OFFICE)  POC COVID19/FLU A&B COMBO    EKG   Radiology No results found.  Procedures Procedures (including critical care time)  Medications Ordered in UC Medications - No data to display  Initial Impression / Assessment and Plan / UC Course  I have reviewed the triage vital signs and the nursing notes.  Pertinent labs & imaging results that were available during my care of the patient were reviewed by me and considered in my medical decision making (see chart for details).     Vitals and triage reviewed.  Initial blood pressure 150/102.  Repeat blood pressure 134/90.  He reports compliance with antihypertensive medications.  Patient presented with sore throat ongoing for 3 days associated with nasal congestion/drainage and cough.  Rapid strep, COVID/flu negative.  Suspect viral pharyngitis.  Patient is advised to rest, increase fluid intake.  Gargle with salt water.  Take acetaminophen /Tylenol  and/or ibuprofen /Advil  for fever, pain.  Saline nasal spray for nasal congestion.  Strict ER and return to clinic precautions discussed, patient verbalized understanding. Final Clinical Impressions(s) / UC Diagnoses   Final diagnoses:  Sore throat  Hypertension, unspecified type     Discharge Instructions      Your strep, COVID and flu tests were negative today. Get plenty of rest.  Increase fluids.  Salt water gargles for sore throat.  Nasal saline spray may be beneficial in relieving nasal congestion.   Tylenol /acetaminophen  and/or Advil /ibuprofen  for fever, pain. Your blood pressure improved while here, but remains elevated.  Take your blood pressure medications as prescribed.   If you develop any new or worsening symptoms or if your symptoms do not start to improve, please return here or follow-up with your primary care provider.  If your symptoms are severe, please go to the emergency room.     ED Prescriptions   None    I have reviewed the PDMP during this encounter.   Lennice Jon BROCKS, FNP 09/26/24 [phone]

## 2024-09-26 NOTE — ED Triage Notes (Signed)
 Pt c/o sore throat with difficulty to swallow x3 days. Denies taken any meds for sx's.

## 2024-09-28 ENCOUNTER — Emergency Department (HOSPITAL_COMMUNITY)

## 2024-09-28 ENCOUNTER — Observation Stay (HOSPITAL_COMMUNITY)
Admission: EM | Admit: 2024-09-28 | Discharge: 2024-09-29 | Disposition: A | Discharge status: Home / Self Care | Attending: Internal Medicine | Admitting: Internal Medicine

## 2024-09-28 ENCOUNTER — Other Ambulatory Visit: Payer: Self-pay

## 2024-09-28 DIAGNOSIS — I2510 Atherosclerotic heart disease of native coronary artery without angina pectoris: Secondary | ICD-10-CM | POA: Insufficient documentation

## 2024-09-28 DIAGNOSIS — J029 Acute pharyngitis, unspecified: Secondary | ICD-10-CM | POA: Diagnosis present

## 2024-09-28 DIAGNOSIS — J36 Peritonsillar abscess: Secondary | ICD-10-CM | POA: Diagnosis not present

## 2024-09-28 DIAGNOSIS — I509 Heart failure, unspecified: Secondary | ICD-10-CM | POA: Insufficient documentation

## 2024-09-28 DIAGNOSIS — E663 Overweight: Secondary | ICD-10-CM | POA: Diagnosis not present

## 2024-09-28 DIAGNOSIS — Z79899 Other long term (current) drug therapy: Secondary | ICD-10-CM | POA: Insufficient documentation

## 2024-09-28 DIAGNOSIS — E785 Hyperlipidemia, unspecified: Secondary | ICD-10-CM | POA: Diagnosis present

## 2024-09-28 DIAGNOSIS — I10 Essential (primary) hypertension: Secondary | ICD-10-CM | POA: Diagnosis present

## 2024-09-28 DIAGNOSIS — I255 Ischemic cardiomyopathy: Secondary | ICD-10-CM | POA: Diagnosis present

## 2024-09-28 DIAGNOSIS — I110 Hypertensive heart disease with heart failure: Secondary | ICD-10-CM | POA: Diagnosis not present

## 2024-09-28 DIAGNOSIS — Z7982 Long term (current) use of aspirin: Secondary | ICD-10-CM | POA: Diagnosis not present

## 2024-09-28 LAB — CBC WITH DIFFERENTIAL/PLATELET
Abs Immature Granulocytes: 0.15 K/uL — ABNORMAL HIGH (ref 0.00–0.07)
Basophils Absolute: 0.1 K/uL (ref 0.0–0.1)
Basophils Relative: 0 %
Eosinophils Absolute: 0.1 K/uL (ref 0.0–0.5)
Eosinophils Relative: 0 %
HCT: 45.1 % (ref 39.0–52.0)
Hemoglobin: 14.3 g/dL (ref 13.0–17.0)
Immature Granulocytes: 1 %
Lymphocytes Relative: 17 %
Lymphs Abs: 3.6 K/uL (ref 0.7–4.0)
MCH: 26.9 pg (ref 26.0–34.0)
MCHC: 31.7 g/dL (ref 30.0–36.0)
MCV: 84.9 fL (ref 80.0–100.0)
Monocytes Absolute: 2.2 K/uL — ABNORMAL HIGH (ref 0.1–1.0)
Monocytes Relative: 11 %
Neutro Abs: 14.7 K/uL — ABNORMAL HIGH (ref 1.7–7.7)
Neutrophils Relative %: 71 %
Platelets: 241 K/uL (ref 150–400)
RBC: 5.31 MIL/uL (ref 4.22–5.81)
RDW: 14.6 % (ref 11.5–15.5)
WBC: 20.7 K/uL — ABNORMAL HIGH (ref 4.0–10.5)
nRBC: 0 % (ref 0.0–0.2)

## 2024-09-28 LAB — COMPREHENSIVE METABOLIC PANEL WITH GFR
ALT: 42 U/L (ref 0–44)
AST: 28 U/L (ref 15–41)
Albumin: 3.2 g/dL — ABNORMAL LOW (ref 3.5–5.0)
Alkaline Phosphatase: 83 U/L (ref 38–126)
Anion gap: 13 (ref 5–15)
BUN: 15 mg/dL (ref 8–23)
CO2: 20 mmol/L — ABNORMAL LOW (ref 22–32)
Calcium: 8.8 mg/dL — ABNORMAL LOW (ref 8.9–10.3)
Chloride: 99 mmol/L (ref 98–111)
Creatinine, Ser: 0.96 mg/dL (ref 0.61–1.24)
GFR, Estimated: >60 mL/min (ref >60)
Glucose, Bld: 111 mg/dL — ABNORMAL HIGH (ref 70–99)
Potassium: 3.8 mmol/L (ref 3.5–5.1)
Sodium: 132 mmol/L — ABNORMAL LOW (ref 135–145)
Total Bilirubin: 1.2 mg/dL (ref 0.0–1.2)
Total Protein: 8.1 g/dL (ref 6.5–8.1)

## 2024-09-28 MED ORDER — VANCOMYCIN HCL 2000 MG/400ML IV SOLN
2000.0000 mg | Freq: Once | INTRAVENOUS | Status: AC
  Administered 2024-09-28: 2000 mg via INTRAVENOUS
  Filled 2024-09-28: qty 400

## 2024-09-28 MED ORDER — SODIUM CHLORIDE 0.9 % IV BOLUS
1000.0000 mL | Freq: Once | INTRAVENOUS | Status: AC
  Administered 2024-09-28: 1000 mL via INTRAVENOUS

## 2024-09-28 MED ORDER — MORPHINE SULFATE (PF) 2 MG/ML IV SOLN: 2.0000 mg | INTRAVENOUS | Status: DC | PRN

## 2024-09-28 MED ORDER — CARVEDILOL 6.25 MG PO TABS
6.2500 mg | ORAL_TABLET | Freq: Two times a day (BID) | ORAL | Status: DC
  Administered 2024-09-29: 6.25 mg via ORAL
  Filled 2024-09-28: qty 1

## 2024-09-28 MED ORDER — LOSARTAN POTASSIUM 50 MG PO TABS
25.0000 mg | ORAL_TABLET | Freq: Every day | ORAL | Status: DC
  Administered 2024-09-29: 25 mg via ORAL
  Filled 2024-09-28: qty 1

## 2024-09-28 MED ORDER — GABAPENTIN 100 MG PO CAPS
100.0000 mg | ORAL_CAPSULE | Freq: Three times a day (TID) | ORAL | Status: DC
  Administered 2024-09-29: 100 mg via ORAL
  Filled 2024-09-28: qty 1

## 2024-09-28 MED ORDER — ATORVASTATIN CALCIUM 80 MG PO TABS
80.0000 mg | ORAL_TABLET | Freq: Every day | ORAL | Status: DC
  Administered 2024-09-29: 80 mg via ORAL
  Filled 2024-09-28: qty 1

## 2024-09-28 MED ORDER — SPIRONOLACTONE 12.5 MG HALF TABLET
12.5000 mg | ORAL_TABLET | Freq: Every day | ORAL | Status: DC
  Administered 2024-09-29: 12.5 mg via ORAL
  Filled 2024-09-28: qty 1

## 2024-09-28 MED ORDER — ONDANSETRON HCL 4 MG PO TABS
4.0000 mg | ORAL_TABLET | Freq: Four times a day (QID) | ORAL | Status: DC | PRN
Start: 2024-09-28 — End: 2024-09-29

## 2024-09-28 MED ORDER — IOHEXOL 350 MG/ML SOLN
75.0000 mL | Freq: Once | INTRAVENOUS | Status: AC | PRN
  Administered 2024-09-28: 75 mL via INTRAVENOUS

## 2024-09-28 MED ORDER — CEFTRIAXONE SODIUM 2 G IJ SOLR
2.0000 g | Freq: Once | INTRAMUSCULAR | Status: AC
  Administered 2024-09-28: 2 g via INTRAVENOUS
  Filled 2024-09-28: qty 20

## 2024-09-28 MED ORDER — ONDANSETRON HCL 4 MG/2ML IJ SOLN: 4.0000 mg | Freq: Four times a day (QID) | INTRAMUSCULAR | Status: DC | PRN

## 2024-09-28 MED ORDER — ASPIRIN 81 MG PO CHEW
81.0000 mg | CHEWABLE_TABLET | Freq: Every day | ORAL | Status: DC
  Administered 2024-09-29: 81 mg via ORAL
  Filled 2024-09-28: qty 1

## 2024-09-28 MED ORDER — PRASUGREL HCL 10 MG PO TABS
10.0000 mg | ORAL_TABLET | Freq: Every day | ORAL | Status: DC
  Administered 2024-09-29: 10 mg via ORAL
  Filled 2024-09-28: qty 1

## 2024-09-28 MED ORDER — CLINDAMYCIN PHOSPHATE 300 MG/50ML IV SOLN
300.0000 mg | Freq: Three times a day (TID) | INTRAVENOUS | Status: DC
  Administered 2024-09-29: 300 mg via INTRAVENOUS
  Filled 2024-09-28 (×2): qty 50

## 2024-09-28 MED ORDER — KETOROLAC TROMETHAMINE 30 MG/ML IJ SOLN
15.0000 mg | Freq: Once | INTRAMUSCULAR | Status: AC
  Administered 2024-09-28: 15 mg via INTRAVENOUS
  Filled 2024-09-28: qty 1

## 2024-09-28 MED ORDER — DEXTROSE IN LACTATED RINGERS 5 % IV SOLN: INTRAVENOUS | Status: DC

## 2024-09-28 MED ORDER — NITROGLYCERIN 0.4 MG SL SUBL: 0.4000 mg | SUBLINGUAL_TABLET | SUBLINGUAL | Status: DC | PRN

## 2024-09-28 MED ORDER — OXYCODONE HCL ER 10 MG PO T12A: 10.0000 mg | EXTENDED_RELEASE_TABLET | Freq: Two times a day (BID) | ORAL | Status: DC

## 2024-09-28 MED ORDER — OXYCODONE HCL 5 MG PO TABS: 5.0000 mg | ORAL_TABLET | Freq: Four times a day (QID) | ORAL | Status: DC | PRN

## 2024-09-28 MED ORDER — DEXAMETHASONE SOD PHOSPHATE PF 10 MG/ML IJ SOLN
10.0000 mg | Freq: Once | INTRAMUSCULAR | Status: AC
  Administered 2024-09-28: 10 mg via INTRAVENOUS

## 2024-09-28 NOTE — ED Triage Notes (Signed)
 Patient here with complaints of sore throat 2 weeks with difficulty swallowing. He denies taking any antibiotics. Denies fever and chills.

## 2024-09-28 NOTE — Progress Notes (Signed)
 ED Pharmacy Antibiotic Sign Off An antibiotic consult was received from an ED provider for vancomycin per pharmacy dosing for epiglottis. A chart review was completed to assess appropriateness.  A single dose of ceftriaxone placed by the ED provider.   The following one time order(s) were placed per pharmacy consult:  vancomycin 2000 mg x 1 dose  Further antibiotic and/or antibiotic pharmacy consults should be ordered by the admitting provider if indicated.   Thank you for allowing pharmacy to be a part of this patient's care.   Dorn Buttner, PharmD, BCPS 09/28/2024 10:23 PM ED Clinical Pharmacist -  [phone]

## 2024-09-28 NOTE — H&P (Signed)
 History and Physical    Patient: Todd Lynn FMW:[id number] DOB: 1956-07-27 DOA: 09/28/2024 DOS: the patient was seen and examined on 09/28/2024 PCP: Dyane Anthony RAMAN, FNP  Patient coming from: Home  Chief Complaint:  Chief Complaint  Patient presents with   Sore Throat   HPI: Todd Lynn is a 68 y.o. male with medical history significant of Ischemic cardiomyopathy, coronary artery disease, diastolic dysfunction CHF, essential hypertension, osteoarthritis, who presented to the ER with 2 weeks of sore throat and difficulty swallowing.  He denied any cold symptoms prior to this.  Denied any fever or chills denied any nausea vomiting or diarrhea.  Patient did not take any antibiotics.  He was seen in the ER with significant pain and nausea.  Patient was found to have leukocytosis, hyponatremia, CT of the neck shows findings close with acute right-sided tonsillitis or pharyngitis with superimposed peritonsillar abscess.  Also some cervical lymphadenopathy.  Dr. Roark was consulted and plans surgery tomorrow.  Patient to be admitted to medical service with IV antibiotics.  Review of Systems: As mentioned in the history of present illness. All other systems reviewed and are negative. Past Medical History:  Diagnosis Date   Arthritis    CHF (congestive heart failure) (HCC)    improved with medications   Hyperlipidemia    Hypertension    Myocardial infarction [facility])    01-22-24  stents   Past Surgical History:  Procedure Laterality Date   CORONARY/GRAFT ACUTE MI REVASCULARIZATION N/A 01/22/2024   Procedure: Coronary/Graft Acute MI Revascularization;  Surgeon: Darron Deatrice LABOR, MD;  [facility];  Service: Cardiovascular;  Laterality: N/A;   HAND SURGERY Right    LEFT HEART CATH AND CORONARY ANGIOGRAPHY N/A 01/22/2024   Procedure: LEFT HEART CATH AND CORONARY ANGIOGRAPHY;  Surgeon: Darron Deatrice LABOR, MD;  [facility];  Service: Cardiovascular;   Laterality: N/A;   NECK SURGERY     SHOULDER ARTHROSCOPY WITH ROTATOR CUFF REPAIR Right 08/22/2024   Procedure: ARTHROSCOPY, SHOULDER, WITH ROTATOR CUFF REPAIR;  Surgeon: Cristy Bonner DASEN, MD;  Location: [facility];  Service: Orthopedics;  Laterality: Right;   Social History:  reports that he has never smoked. He has quit using smokeless tobacco.  His smokeless tobacco use included chew. He reports current alcohol use. He reports that he does not use drugs.  No Known Allergies  No family history on file.  Prior to Admission medications   Medication Sig Start Date End Date Taking? Authorizing Provider  aspirin  81 MG chewable tablet Chew 1 tablet (81 mg total) by mouth daily. 01/24/24   Henry Manuelita NOVAK, NP  atorvastatin  (LIPITOR) 80 MG tablet Take 1 tablet (80 mg total) by mouth daily. 07/19/24   Henry Manuelita NOVAK, NP  Blood Pressure Monitoring (BLOOD PRESSURE CUFF) MISC 1 each by Does not apply route daily. 02/01/24   Wyn Jackee VEAR Mickey., NP  carvedilol  (COREG ) 6.25 MG tablet Take 1 tablet (6.25 mg total) by mouth 2 (two) times daily with a meal. 08/20/24   Wonda Sharper, MD  gabapentin  (NEURONTIN ) 100 MG capsule Take 1 capsule (100 mg total) by mouth 3 (three) times daily. For pain 08/22/24   Jennye Aleck SAILOR, PA-C  losartan  (COZAAR ) 25 MG tablet Take 1 tablet (25 mg total) by mouth daily. 07/19/24   Henry Manuelita NOVAK, NP  nitroGLYCERIN  (NITROSTAT ) 0.4 MG SL tablet Place 1 tablet (0.4 mg total) under the tongue every 5 (five) minutes as needed. 01/23/24   Henry,  Manuelita NOVAK, NP  oxyCODONE  (OXY IR/ROXICODONE ) 5 MG immediate release tablet Take 1 tablet (5 mg total) by mouth every 6 (six) hours for severe pain after surgery 08/22/24     prasugrel  (EFFIENT ) 10 MG TABS tablet Take 1 tablet (10 mg total) by mouth daily. 01/24/24   Henry Manuelita NOVAK, NP  spironolactone  (ALDACTONE ) 25 MG tablet Take 1 tablet (25 mg total) by mouth daily. Patient taking differently: Take 12.5 mg by mouth daily. 02/01/24        Physical Exam: Vitals:   09/28/24 1325 09/28/24 1336 09/28/24 1641 09/28/24 2140  BP: (!) 152/97  (!) 137/96 (!) 129/93  Pulse: 80  78 73  Resp: (!) 21  16 15   Temp: 98.4 F (36.9 C)  98.6 F (37 C) 98 F (36.7 C)  TempSrc:   Oral Oral  SpO2: 95%  100% 96%  Weight:  101.2 kg    Height:  5' 8 (1.727 m)     Constitutional: Acutely ill looking, NAD, calm, comfortable Eyes: PERRL, lids and conjunctivae normal ENMT: Mucous membranes are moist. Posterior pharynx clear of any exudate or lesions.Normal dentition.  Neck: Swollen, tender, lymphadenopathy, tonsils swollen right Respiratory: clear to auscultation bilaterally, no wheezing, no crackles. Normal respiratory effort. No accessory muscle use.  Cardiovascular: Regular rate and rhythm, no murmurs / rubs / gallops. No extremity edema. 2+ pedal pulses. No carotid bruits.  Abdomen: no tenderness, no masses palpated. No hepatosplenomegaly. Bowel sounds positive.  Musculoskeletal: Good range of motion, no joint swelling or tenderness, Skin: no rashes, lesions, ulcers. No induration Neurologic: CN 2-12 grossly intact. Sensation intact, DTR normal. Strength 5/5 in all 4.  Psychiatric: Normal judgment and insight. Alert and oriented x 3. Normal mood  Data Reviewed:  Temperature 98.6, blood pressure 129/93, pulse 80, respiratory 21, white count 20.7, sodium 132 CO2 20 calcium  8.8 glucose 111.  CT neck soft tissue showed findings consistent with acute right-sided tonsillitis and pharyngitis with superimposed to distal right tonsillar peritonsillar abscess measuring up to 16 mm stable.  Enlarged right upper cervical lymph nodes presumably reactive also scattered mucosal thickening throughout the ethmoidal air cells and maxillary sinuses  Assessment and Plan:  #1 peritonsillar abscess: Probably strep or haemophilus influenza.  Will admit the patient.  Initiate IV antibiotics with clindamycin.  Coverage for anaerobic's.  Patient has  consultation with ENT.  Will keep him n.p.o. after midnight for possible surgery tomorrow.  Will hold of anticoagulation.  #2 ischemic cardiomyopathy: Patient has had previous PCI's.  Currently doing better.  Continue to monitor.  #3 essential hypertension: Continue with home regimen.  #4 hyperlipidemia: Continue with statin  #5 coronary artery disease: Stable.  Continue home regimen.    Advance Care Planning:   Code Status: Prior full code  Consults: Dr. Roark, ENT  Family Communication: Daughter at bedside  Severity of Illness: The appropriate patient status for this patient is INPATIENT. Inpatient status is judged to be reasonable and necessary in order to provide the required intensity of service to ensure the patient's safety. The patient's presenting symptoms, physical exam findings, and initial radiographic and laboratory data in the context of their chronic comorbidities is felt to place them at high risk for further clinical deterioration. Furthermore, it is not anticipated that the patient will be medically stable for discharge from the hospital within 2 midnights of admission.   * I certify that at the point of admission it is my clinical judgment that the patient will require inpatient hospital care  spanning beyond 2 midnights from the point of admission due to high intensity of service, high risk for further deterioration and high frequency of surveillance required.*  AuthorBETHA SIM KNOLL, MD 09/28/2024 11:10 PM  For on call review [email or web address].

## 2024-09-28 NOTE — ED Provider Notes (Signed)
 [place] EMERGENCY DEPARTMENT AT [facility] Provider Note   CSN: [id number] Arrival date & time: 09/28/24  1318     Patient presents with: Sore Throat   Todd Lynn is a 68 y.o. male.   Patient to ED with complaint of right sided sore throat that started about 5 days ago. Seen at Urgent Care on day 3 and had a negative strep, negative COVID. Felt to be viral and recommended supportive care. Symptoms worsening since that time. He states swallowing is painful and he has not been eating and drinking well. No fever. No congestion, cough.   The history is provided by the patient. No language interpreter was used.  Sore Throat       Prior to Admission medications   Medication Sig Start Date End Date Taking? Authorizing Provider  aspirin  81 MG chewable tablet Chew 1 tablet (81 mg total) by mouth daily. 01/24/24   Henry Manuelita NOVAK, NP  atorvastatin  (LIPITOR) 80 MG tablet Take 1 tablet (80 mg total) by mouth daily. 07/19/24   Henry Manuelita NOVAK, NP  Blood Pressure Monitoring (BLOOD PRESSURE CUFF) MISC 1 each by Does not apply route daily. 02/01/24   Wyn Jackee VEAR Mickey., NP  carvedilol  (COREG ) 6.25 MG tablet Take 1 tablet (6.25 mg total) by mouth 2 (two) times daily with a meal. 08/20/24   Wonda Sharper, MD  gabapentin  (NEURONTIN ) 100 MG capsule Take 1 capsule (100 mg total) by mouth 3 (three) times daily. For pain 08/22/24   Jennye Aleck SAILOR, PA-C  losartan  (COZAAR ) 25 MG tablet Take 1 tablet (25 mg total) by mouth daily. 07/19/24   Henry Manuelita NOVAK, NP  nitroGLYCERIN  (NITROSTAT ) 0.4 MG SL tablet Place 1 tablet (0.4 mg total) under the tongue every 5 (five) minutes as needed. 01/23/24   Henry Manuelita NOVAK, NP  oxyCODONE  (OXY IR/ROXICODONE ) 5 MG immediate release tablet Take 1 tablet (5 mg total) by mouth every 6 (six) hours for severe pain after surgery 08/22/24     prasugrel  (EFFIENT ) 10 MG TABS tablet Take 1 tablet (10 mg total) by mouth daily. 01/24/24   Henry Manuelita NOVAK, NP  spironolactone  (ALDACTONE ) 25 MG tablet Take 1 tablet (25 mg total) by mouth daily. Patient taking differently: Take 12.5 mg by mouth daily. 02/01/24       Allergies: Patient has no known allergies.    Review of Systems  Updated Vital Signs BP (!) 129/93 (BP Location: Right Arm)   Pulse 73   Temp 98 F (36.7 C) (Oral)   Resp 15   Ht 5' 8 (1.727 m)   Wt 101.2 kg   SpO2 96%   BMI 33.91 kg/m   Physical Exam Vitals and nursing note reviewed.  Constitutional:      Appearance: He is well-developed.  HENT:     Head: Normocephalic.     Mouth/Throat:     Mouth: Mucous membranes are moist.     Comments: Right peritonsillar fullness. Mild trismus is present. Uvular shift that is minimal.  Eyes:     Conjunctiva/sclera: Conjunctivae normal.  Cardiovascular:     Rate and Rhythm: Normal rate and regular rhythm.     Heart sounds: No murmur heard. Pulmonary:     Effort: Pulmonary effort is normal.     Breath sounds: Normal breath sounds. No wheezing, rhonchi or rales.  Abdominal:     General: Bowel sounds are normal.     Palpations: Abdomen is soft.     Tenderness: There  is no abdominal tenderness. There is no guarding or rebound.  Musculoskeletal:        General: Normal range of motion.     Cervical back: Normal range of motion and neck supple.  Skin:    General: Skin is warm and dry.  Neurological:     General: No focal deficit present.     Mental Status: He is alert and oriented to person, place, and time.     (all labs ordered are listed, but only abnormal results are displayed) Labs Reviewed  CBC WITH DIFFERENTIAL/PLATELET - Abnormal; Notable for the following components:      Result Value   WBC 20.7 (*)    Neutro Abs 14.7 (*)    Monocytes Absolute 2.2 (*)    Abs Immature Granulocytes 0.15 (*)    All other components within normal limits  COMPREHENSIVE METABOLIC PANEL WITH GFR - Abnormal; Notable for the following components:   Sodium 132 (*)    CO2 20 (*)     Glucose, Bld 111 (*)    Calcium  8.8 (*)    Albumin 3.2 (*)    All other components within normal limits    EKG: None  Radiology: CT Soft Tissue Neck W Contrast Result Date: 09/28/2024 CLINICAL DATA:  Initial evaluation for acute peritonsillar abscess. EXAM: CT NECK WITH CONTRAST TECHNIQUE: Multidetector CT imaging of the neck was performed using the standard protocol following the bolus administration of intravenous contrast. RADIATION DOSE REDUCTION: This exam was performed according to the departmental dose-optimization program which includes automated exposure control, adjustment of the mA and/or kV according to patient size and/or use of iterative reconstruction technique. CONTRAST:  75mL OMNIPAQUE  IOHEXOL  350 MG/ML SOLN COMPARISON:  None Available. FINDINGS: Pharynx and larynx: Oral cavity within normal limits. Palatine tonsils are enlarged and hypertrophied, consistent with acute tonsillitis. Changes are worse on the right. Superimposed hypodense collection measuring 12 x 7 x 16 mm at the lateral margin of the right tonsil (series 4, image 64). An additional smaller collection measuring 10 x 5 x 14 mm at the medial right tonsil (series 4, image 64). Findings consistent with small tonsillar/peritonsillar abscesses. Associated mucosal edema within the right greater than left pharynx, compatible with associated pharyngitis. Right aspect of the epiglottis is mildly thickened. Hazy inflammatory stranding extends to involve the right parapharyngeal and submandibular spaces. Associated small reactive layering retropharyngeal effusion without loculated retropharyngeal abscess. Remainder of the hypopharynx and supraglottic larynx otherwise grossly within normal limits, although evaluation limited by motion. Glottis grossly within normal limits. Subglottic airway clear. Salivary glands: Parotid glands within normal limits. Left submandibular gland normal. Hazy inflammatory changes partially surround the  right submandibular gland related to the acute inflammatory process within the right neck. Thyroid: Normal. Lymph nodes: Enlarged right upper cervical lymph nodes, with the largest nodal conglomerate measuring up to 1.8 cm short axis, presumably reactive. Vascular: Normal intravascular enhancement seen throughout the neck. Atheromatous change about the carotid bifurcations and skull base. Limited intracranial: Unremarkable. Visualized orbits: Unremarkable. Mastoids and visualized paranasal sinuses: Scattered mucosal thickening present throughout the ethmoidal air cells and maxillary sinuses. Superimposed air-fluid levels within both maxillary sinuses. Mastoid air cells and middle ear cavities are clear. Skeleton: No worrisome osseous lesions. Prior fusion at C3-4. Moderate spondylosis at C4-5 and C5-6. Upper chest: No other acute finding.  Visualized lungs are clear. Other: None. IMPRESSION: 1. Findings consistent with acute right-sided tonsillitis/pharyngitis, with superimposed 2 distinct right tonsillar/peritonsillar abscesses measuring up to 16 mm as above. 2. Enlarged  right upper cervical lymph nodes, presumably reactive. 3. Scattered mucosal thickening throughout the ethmoidal air cells and maxillary sinuses, with superimposed air-fluid levels within both maxillary sinuses. Clinical correlation for concomitant acute sinusitis recommended. Electronically Signed   By: Morene Hoard M.D.   On: 09/28/2024 21:38     Procedures   Medications Ordered in the ED  sodium chloride  0.9 % bolus 1,000 mL (1,000 mLs Intravenous New Bag/Given 09/28/24 1856)  ketorolac  (TORADOL ) 30 MG/ML injection 15 mg (15 mg Intravenous Given 09/28/24 1852)  cefTRIAXone (ROCEPHIN) 2 g in sodium chloride  0.9 % 100 mL IVPB (2 g Intravenous New Bag/Given 09/28/24 1856)  dexamethasone  (DECADRON ) injection 10 mg (10 mg Intravenous Given 09/28/24 1852)  iohexol  (OMNIPAQUE ) 350 MG/ML injection 75 mL (75 mLs Intravenous Contrast  Given 09/28/24 2106)    Clinical Course as of 09/28/24 2223  Fri Sep 28, 2024  8165 Patient with unilateral sore throat x 5 days. No fever. Managing secretions but decreased regular PO intake due to pain. Not immunocompromised. Will check labs, give some IV fluids, Rocephin 2 gm, decadron , toradol . Will re-evaluated after these measures.  [SU]  1944 On recheck, he is feeling some better after fluids and medications. Leukocytosis of 20., labs reassuring otherwise. He is given a PO challenge. [SU]  2002 Feeling better after IV fluids, medications. Drinking without difficulty. Dr. Simon to see and evaluate.  [SU]  2212 CT neck w/CM:  IMPRESSION: 1. Findings consistent with acute right-sided tonsillitis/pharyngitis, with superimposed 2 distinct right tonsillar/peritonsillar abscesses measuring up to 16 mm as above. 2. Enlarged right upper cervical lymph nodes, presumably reactive. 3. Scattered mucosal thickening throughout the ethmoidal air cells and maxillary sinuses, with superimposed air-fluid levels within both maxillary sinuses. Clinical correlation for concomitant acute sinusitis recommended.  Findings were discussed with Dr. Roark, ENT, who advises with epiglottis inflammation, admission for continued antibiotics would be recommended. He will provide consultation in the morning.  [SU]  2215 Hospitalist paged.  [SU]  2222 Dr. Sim with [facility] accepting. [SU]    Clinical Course User Index [SU] Odell Balls, PA-C                                 Medical Decision Making Amount and/or Complexity of Data Reviewed Labs: ordered.  Risk Prescription drug management.        Final diagnoses:  Peritonsillar abscess    ED Discharge Orders     None          Odell Balls, PA-C 09/28/24 2216    Simon Lavonia SAILOR, MD 09/28/24 2220

## 2024-09-29 ENCOUNTER — Encounter (HOSPITAL_COMMUNITY): Payer: Self-pay | Admitting: Internal Medicine

## 2024-09-29 ENCOUNTER — Other Ambulatory Visit (HOSPITAL_COMMUNITY): Payer: Self-pay

## 2024-09-29 DIAGNOSIS — J36 Peritonsillar abscess: Secondary | ICD-10-CM | POA: Diagnosis not present

## 2024-09-29 LAB — COMPREHENSIVE METABOLIC PANEL WITH GFR
ALT: 40 U/L (ref 0–44)
AST: 26 U/L (ref 15–41)
Albumin: 2.9 g/dL — ABNORMAL LOW (ref 3.5–5.0)
Alkaline Phosphatase: 81 U/L (ref 38–126)
Anion gap: 11 (ref 5–15)
BUN: 18 mg/dL (ref 8–23)
CO2: 20 mmol/L — ABNORMAL LOW (ref 22–32)
Calcium: 8.7 mg/dL — ABNORMAL LOW (ref 8.9–10.3)
Chloride: 100 mmol/L (ref 98–111)
Creatinine, Ser: 1.04 mg/dL (ref 0.61–1.24)
GFR, Estimated: >60 mL/min (ref >60)
Glucose, Bld: 160 mg/dL — ABNORMAL HIGH (ref 70–99)
Potassium: 3.9 mmol/L (ref 3.5–5.1)
Sodium: 131 mmol/L — ABNORMAL LOW (ref 135–145)
Total Bilirubin: 1 mg/dL (ref 0.0–1.2)
Total Protein: 7.7 g/dL (ref 6.5–8.1)

## 2024-09-29 LAB — SURGICAL PCR SCREEN
MRSA, PCR: NEGATIVE
Staphylococcus aureus: POSITIVE — AB

## 2024-09-29 LAB — CBC
HCT: 40.3 % (ref 39.0–52.0)
Hemoglobin: 13.3 g/dL (ref 13.0–17.0)
MCH: 27.1 pg (ref 26.0–34.0)
MCHC: 33 g/dL (ref 30.0–36.0)
MCV: 82.2 fL (ref 80.0–100.0)
Platelets: 247 K/uL (ref 150–400)
RBC: 4.9 MIL/uL (ref 4.22–5.81)
RDW: 14.6 % (ref 11.5–15.5)
WBC: 19.5 K/uL — ABNORMAL HIGH (ref 4.0–10.5)
nRBC: 0 % (ref 0.0–0.2)

## 2024-09-29 LAB — HIV ANTIBODY (ROUTINE TESTING W REFLEX): HIV Screen 4th Generation wRfx: NONREACTIVE

## 2024-09-29 MED ORDER — GABAPENTIN 100 MG PO CAPS
100.0000 mg | ORAL_CAPSULE | Freq: Three times a day (TID) | ORAL | Status: AC | PRN
Start: 2024-09-29 — End: ?

## 2024-09-29 MED ORDER — AMOXICILLIN-POT CLAVULANATE 875-125 MG PO TABS
1.0000 | ORAL_TABLET | Freq: Two times a day (BID) | ORAL | Status: DC
  Administered 2024-09-29: 1 via ORAL
  Filled 2024-09-29: qty 1

## 2024-09-29 MED ORDER — MUPIROCIN 2 % EX OINT
1.0000 | TOPICAL_OINTMENT | Freq: Two times a day (BID) | CUTANEOUS | Status: DC
  Administered 2024-09-29: 1 via NASAL
  Filled 2024-09-29: qty 22

## 2024-09-29 MED ORDER — AMOXICILLIN-POT CLAVULANATE 875-125 MG PO TABS
1.0000 | ORAL_TABLET | Freq: Two times a day (BID) | ORAL | 0 refills | Status: AC
  Filled 2024-09-29 (×2): qty 12, 6d supply, fill #0

## 2024-09-29 MED ORDER — OXYCODONE HCL 5 MG PO TABS
5.0000 mg | ORAL_TABLET | Freq: Four times a day (QID) | ORAL | 0 refills | Status: AC | PRN
  Filled 2024-09-29 (×2): qty 12, 3d supply, fill #0

## 2024-09-29 NOTE — Discharge Summary (Signed)
 Physician Discharge Summary  REFORD OLLIFF FMW:[id number] DOB: September 18, 1956 DOA: 09/28/2024  PCP: Dyane Anthony RAMAN, FNP  Admit date: 09/28/2024 Discharge date: 09/29/2024  Admitted From:  Discharge disposition: home   Recommendations for Outpatient Follow-Up:   [facility] abx BMP/CBC 1 week   Discharge Diagnosis:   Principal Problem:   Peritonsillar abscess Active Problems:   Overweight   Hyperlipidemia   Ischemic cardiomyopathy   Essential hypertension    Discharge Condition: Improved.  Diet recommendation: soft.  Wound care: None.  Code status: Full.   History of Present Illness:   Todd Lynn is a 68 y.o. male with medical history significant of Ischemic cardiomyopathy, coronary artery disease, diastolic dysfunction CHF, essential hypertension, osteoarthritis, who presented to the ER with 2 weeks of sore throat and difficulty swallowing.  He denied any cold symptoms prior to this.  Denied any fever or chills denied any nausea vomiting or diarrhea.  Patient did not take any antibiotics.  He was seen in the ER with significant pain and nausea.  Patient was found to have leukocytosis, hyponatremia, CT of the neck shows findings close with acute right-sided tonsillitis or pharyngitis with superimposed peritonsillar abscess.  Also some cervical lymphadenopathy.  Dr. Roark was consulted and plans surgery tomorrow.  Patient to be admitted to medical service with IV antibiotics.    Hospital Course by Problem:   peritonsillar abscess:  ENT consult S/p I and D -change to PO to finish course    ischemic cardiomyopathy: Patient has had previous PCI's.  Currently doing better.  Continue to monitor.   essential hypertension: Continue with home regimen.   hyperlipidemia: Continue with statin    coronary artery disease: Stable.  Continue home regimen.   Hyponatremia -mild   Medical Consultants:   ENT   Discharge Exam:   Vitals:   09/29/24 0516  09/29/24 0802  BP: 121/76 (!) 136/93  Pulse: 75 67  Resp: 17 20  Temp: 97.7 F (36.5 C) 98 F (36.7 C)  SpO2: 98% 100%   Vitals:   09/28/24 2355 09/29/24 0012 09/29/24 0516 09/29/24 0802  BP:  127/86 121/76 (!) 136/93  Pulse:  70 75 67  Resp:  17 17 20   Temp:  97.8 F (36.6 C) 97.7 F (36.5 C) 98 F (36.7 C)  TempSrc:  Oral Oral Oral  SpO2: 99% 99% 98% 100%  Weight:      Height:        General exam: Appears calm and comfortable.    The results of significant diagnostics from this hospitalization (including imaging, microbiology, ancillary and laboratory) are listed below for reference.     Procedures and Diagnostic Studies:   CT Soft Tissue Neck W Contrast Result Date: 09/28/2024 CLINICAL DATA:  Initial evaluation for acute peritonsillar abscess. EXAM: CT NECK WITH CONTRAST TECHNIQUE: Multidetector CT imaging of the neck was performed using the standard protocol following the bolus administration of intravenous contrast. RADIATION DOSE REDUCTION: This exam was performed according to the departmental dose-optimization program which includes automated exposure control, adjustment of the mA and/or kV according to patient size and/or use of iterative reconstruction technique. CONTRAST:  75mL OMNIPAQUE  IOHEXOL  350 MG/ML SOLN COMPARISON:  None Available. FINDINGS: Pharynx and larynx: Oral cavity within normal limits. Palatine tonsils are enlarged and hypertrophied, consistent with acute tonsillitis. Changes are worse on the right. Superimposed hypodense collection measuring 12 x 7 x 16 mm at the lateral margin of the right tonsil (series 4, image 64). An additional  smaller collection measuring 10 x 5 x 14 mm at the medial right tonsil (series 4, image 64). Findings consistent with small tonsillar/peritonsillar abscesses. Associated mucosal edema within the right greater than left pharynx, compatible with associated pharyngitis. Right aspect of the epiglottis is mildly thickened. Hazy  inflammatory stranding extends to involve the right parapharyngeal and submandibular spaces. Associated small reactive layering retropharyngeal effusion without loculated retropharyngeal abscess. Remainder of the hypopharynx and supraglottic larynx otherwise grossly within normal limits, although evaluation limited by motion. Glottis grossly within normal limits. Subglottic airway clear. Salivary glands: Parotid glands within normal limits. Left submandibular gland normal. Hazy inflammatory changes partially surround the right submandibular gland related to the acute inflammatory process within the right neck. Thyroid: Normal. Lymph nodes: Enlarged right upper cervical lymph nodes, with the largest nodal conglomerate measuring up to 1.8 cm short axis, presumably reactive. Vascular: Normal intravascular enhancement seen throughout the neck. Atheromatous change about the carotid bifurcations and skull base. Limited intracranial: Unremarkable. Visualized orbits: Unremarkable. Mastoids and visualized paranasal sinuses: Scattered mucosal thickening present throughout the ethmoidal air cells and maxillary sinuses. Superimposed air-fluid levels within both maxillary sinuses. Mastoid air cells and middle ear cavities are clear. Skeleton: No worrisome osseous lesions. Prior fusion at C3-4. Moderate spondylosis at C4-5 and C5-6. Upper chest: No other acute finding.  Visualized lungs are clear. Other: None. IMPRESSION: 1. Findings consistent with acute right-sided tonsillitis/pharyngitis, with superimposed 2 distinct right tonsillar/peritonsillar abscesses measuring up to 16 mm as above. 2. Enlarged right upper cervical lymph nodes, presumably reactive. 3. Scattered mucosal thickening throughout the ethmoidal air cells and maxillary sinuses, with superimposed air-fluid levels within both maxillary sinuses. Clinical correlation for concomitant acute sinusitis recommended. Electronically Signed   By: Morene Hoard M.D.    On: 09/28/2024 21:38     Labs:   Basic Metabolic Panel: Recent Labs  Lab 09/28/24 1901 09/29/24 0009  NA 132* 131*  K 3.8 3.9  CL 99 100  CO2 20* 20*  GLUCOSE 111* 160*  BUN 15 18  CREATININE 0.96 1.04  CALCIUM  8.8* 8.7*   GFR Estimated Creatinine Clearance: 79.5 mL/min (by C-G formula based on SCr of 1.04 mg/dL). Liver Function Tests: Recent Labs  Lab 09/28/24 1901 09/29/24 0009  AST 28 26  ALT 42 40  ALKPHOS 83 81  BILITOT 1.2 1.0  PROT 8.1 7.7  ALBUMIN 3.2* 2.9*   No results for input(s): LIPASE, AMYLASE in the last 168 hours. No results for input(s): AMMONIA in the last 168 hours. Coagulation profile No results for input(s): INR, PROTIME in the last 168 hours.  CBC: Recent Labs  Lab 09/28/24 1901 09/29/24 0009  WBC 20.7* 19.5*  NEUTROABS 14.7*  --   HGB 14.3 13.3  HCT 45.1 40.3  MCV 84.9 82.2  PLT 241 247   Cardiac Enzymes: No results for input(s): CKTOTAL, CKMB, CKMBINDEX, TROPONINI in the last 168 hours. BNP: Invalid input(s): POCBNP CBG: No results for input(s): GLUCAP in the last 168 hours. D-Dimer No results for input(s): DDIMER in the last 72 hours. Hgb A1c No results for input(s): HGBA1C in the last 72 hours. Lipid Profile No results for input(s): CHOL, HDL, LDLCALC, TRIG, CHOLHDL, LDLDIRECT in the last 72 hours. Thyroid function studies No results for input(s): TSH, T4TOTAL, T3FREE, THYROIDAB in the last 72 hours.  Invalid input(s): FREET3 Anemia work up No results for input(s): VITAMINB12, FOLATE, FERRITIN, TIBC, IRON, RETICCTPCT in the last 72 hours. Microbiology Recent Results (from the past 240 hours)  Surgical PCR  screen     Status: Abnormal   Collection Time: 09/29/24  1:50 AM   Specimen: Nasal Mucosa; Nasal Swab  Result Value Ref Range Status   MRSA, PCR NEGATIVE NEGATIVE Final   Staphylococcus aureus POSITIVE (A) NEGATIVE Final    Comment: (NOTE) The Xpert SA  Assay (FDA approved for NASAL specimens in patients 45 years of age and older), is one component of a comprehensive surveillance program. It is not intended to diagnose infection nor to guide or monitor treatment. Performed at [facility], [address], [place], [place] [address]      Discharge Instructions:   Discharge Instructions     Discharge instructions   Complete by: As directed    Soft Can follow if there is not improvement to resolution in 3 days. Follow up if worse or any issues immediately. Call [phone]   Increase activity slowly   Complete by: As directed       Allergies as of 09/29/2024   No Known Allergies      Medication List     TAKE these medications    acetaminophen  500 MG tablet Commonly known as: TYLENOL  Take 500-1,000 mg by mouth every 6 (six) hours as needed for mild pain (pain score 1-3) or headache.   amoxicillin -clavulanate 875-125 MG tablet Commonly known as: AUGMENTIN  Take 1 tablet by mouth every 12 (twelve) hours.   Aspirin  Low Dose 81 MG chewable tablet Generic drug: aspirin  Chew 1 tablet (81 mg total) by mouth daily.   atorvastatin  80 MG tablet Commonly known as: LIPITOR Take 1 tablet (80 mg total) by mouth daily.   carvedilol  6.25 MG tablet Commonly known as: COREG  Take 1 tablet (6.25 mg total) by mouth 2 (two) times daily with a meal.   gabapentin  100 MG capsule Commonly known as: NEURONTIN  Take 1 capsule (100 mg total) by mouth 3 (three) times daily as needed (Rotator cuff pain). For pain   losartan  25 MG tablet Commonly known as: COZAAR  Take 1 tablet (25 mg total) by mouth daily.   naphazoline-pheniramine 0.025-0.3 % ophthalmic solution Commonly known as: NAPHCON-A Place 1 drop into the right eye 4 (four) times daily as needed for eye irritation or allergies.   nitroGLYCERIN  0.4 MG SL tablet Commonly known as: Nitrostat  Place 1 tablet (0.4 mg total) under the tongue every 5 (five) minutes as needed.    ondansetron  4 MG tablet Commonly known as: ZOFRAN  Take 4 mg by mouth every 8 (eight) hours as needed for nausea or vomiting.   oxyCODONE  5 MG immediate release tablet Commonly known as: Oxy IR/ROXICODONE  Take 1 tablet (5 mg total) by mouth every 6 (six) hours as needed for moderate pain (pain score 4-6). What changed:  when to take this reasons to take this   prasugrel  10 MG Tabs tablet Commonly known as: EFFIENT  Take 1 tablet (10 mg total) by mouth daily.   spironolactone  25 MG tablet Commonly known as: ALDACTONE  Take 1 tablet (25 mg total) by mouth daily. What changed: how much to take        Follow-up Information     Dyane Anthony RAMAN, FNP Follow up in 1 week(s).   Specialty: Family Medicine Contact information: [address] [place] [address] [phone]                  Time coordinating discharge: 45 min  Signed:  Harlene RAYMOND Bowl DO  [facility] 09/29/2024, 1:00 PM

## 2024-09-29 NOTE — Consult Note (Signed)
 Reason for Consult:pta Referring Physician: Dr Juvenal Vicenta FORBES Todd Lynn is an 68 y.o. male.  HPI: with 2 weeks of sore throat and swallowing issues. Still able to get fluids. No previous tonsillitis. He has had no airway issues. Ct scan with right PTA about 1.5 cm over all size. There are 2 pockets.   Past Medical History:  Diagnosis Date   Arthritis    CHF (congestive heart failure) (HCC)    improved with medications   Hyperlipidemia    Hypertension    Myocardial infarction [facility])    01-22-24  stents    Past Surgical History:  Procedure Laterality Date   CORONARY/GRAFT ACUTE MI REVASCULARIZATION N/A 01/22/2024   Procedure: Coronary/Graft Acute MI Revascularization;  Surgeon: Darron Deatrice LABOR, MD;  Location: [facility] INVASIVE CV LAB;  Service: Cardiovascular;  Laterality: N/A;   HAND SURGERY Right    LEFT HEART CATH AND CORONARY ANGIOGRAPHY N/A 01/22/2024   Procedure: LEFT HEART CATH AND CORONARY ANGIOGRAPHY;  Surgeon: Darron Deatrice LABOR, MD;  Location: [facility] INVASIVE CV LAB;  Service: Cardiovascular;  Laterality: N/A;   NECK SURGERY     SHOULDER ARTHROSCOPY WITH ROTATOR CUFF REPAIR Right 08/22/2024   Procedure: ARTHROSCOPY, SHOULDER, WITH ROTATOR CUFF REPAIR;  Surgeon: Cristy Bonner DASEN, MD;  Location: [facility] ORS;  Service: Orthopedics;  Laterality: Right;    No family history on file.  Social History:  reports that he has never smoked. He has quit using smokeless tobacco.  His smokeless tobacco use included chew. He reports current alcohol use. He reports that he does not use drugs.  Allergies: No Known Allergies  Medications: I have reviewed the patient's current medications.  Results for orders placed or performed during the hospital encounter of 09/28/24 (from the past 48 hours)  CBC with Differential     Status: Abnormal   Collection Time: 09/28/24  7:01 PM  Result Value Ref Range   WBC 20.7 (H) 4.0 - 10.5 K/uL   RBC 5.31 4.22 - 5.81 MIL/uL   Hemoglobin 14.3 13.0 - 17.0 g/dL   HCT 54.8  60.9 - 47.9 %   MCV 84.9 80.0 - 100.0 fL   MCH 26.9 26.0 - 34.0 pg   MCHC 31.7 30.0 - 36.0 g/dL   RDW 85.3 88.4 - 84.4 %   Platelets 241 150 - 400 K/uL   nRBC 0.0 0.0 - 0.2 %   Neutrophils Relative % 71 %   Neutro Abs 14.7 (H) 1.7 - 7.7 K/uL   Lymphocytes Relative 17 %   Lymphs Abs 3.6 0.7 - 4.0 K/uL   Monocytes Relative 11 %   Monocytes Absolute 2.2 (H) 0.1 - 1.0 K/uL   Eosinophils Relative 0 %   Eosinophils Absolute 0.1 0.0 - 0.5 K/uL   Basophils Relative 0 %   Basophils Absolute 0.1 0.0 - 0.1 K/uL   Immature Granulocytes 1 %   Abs Immature Granulocytes 0.15 (H) 0.00 - 0.07 K/uL    Comment: Performed at [facility], [address] [place], [place] [address]  Comprehensive metabolic panel     Status: Abnormal   Collection Time: 09/28/24  7:01 PM  Result Value Ref Range   Sodium 132 (L) 135 - 145 mmol/L   Potassium 3.8 3.5 - 5.1 mmol/L   Chloride 99 98 - 111 mmol/L   CO2 20 (L) 22 - 32 mmol/L   Glucose, Bld 111 (H) 70 - 99 mg/dL    Comment: Glucose reference range applies only to samples taken after  fasting for at least 8 hours.   BUN 15 8 - 23 mg/dL   Creatinine, Ser 9.03 0.61 - 1.24 mg/dL   Calcium  8.8 (L) 8.9 - 10.3 mg/dL   Total Protein 8.1 6.5 - 8.1 g/dL   Albumin 3.2 (L) 3.5 - 5.0 g/dL   AST 28 15 - 41 U/L   ALT 42 0 - 44 U/L   Alkaline Phosphatase 83 38 - 126 U/L   Total Bilirubin 1.2 0.0 - 1.2 mg/dL   GFR, Estimated >39 >39 mL/min    Comment: (NOTE) Calculated using the CKD-EPI Creatinine Equation (2021)    Anion gap 13 5 - 15    Comment: Performed at [facility], [address] [place], [place] [address]  Comprehensive metabolic panel     Status: Abnormal   Collection Time: 09/29/24 12:09 AM  Result Value Ref Range   Sodium 131 (L) 135 - 145 mmol/L   Potassium 3.9 3.5 - 5.1 mmol/L   Chloride 100 98 - 111 mmol/L   CO2 20 (L) 22 - 32 mmol/L   Glucose, Bld 160 (H) 70 - 99 mg/dL    Comment: Glucose reference range applies only to samples taken  after fasting for at least 8 hours.   BUN 18 8 - 23 mg/dL   Creatinine, Ser 8.95 0.61 - 1.24 mg/dL   Calcium  8.7 (L) 8.9 - 10.3 mg/dL   Total Protein 7.7 6.5 - 8.1 g/dL   Albumin 2.9 (L) 3.5 - 5.0 g/dL   AST 26 15 - 41 U/L   ALT 40 0 - 44 U/L   Alkaline Phosphatase 81 38 - 126 U/L   Total Bilirubin 1.0 0.0 - 1.2 mg/dL   GFR, Estimated >39 >39 mL/min    Comment: (NOTE) Calculated using the CKD-EPI Creatinine Equation (2021)    Anion gap 11 5 - 15    Comment: Performed at [facility], [address] [place], [place] [address]  CBC     Status: Abnormal   Collection Time: 09/29/24 12:09 AM  Result Value Ref Range   WBC 19.5 (H) 4.0 - 10.5 K/uL   RBC 4.90 4.22 - 5.81 MIL/uL   Hemoglobin 13.3 13.0 - 17.0 g/dL   HCT 59.6 60.9 - 47.9 %   MCV 82.2 80.0 - 100.0 fL   MCH 27.1 26.0 - 34.0 pg   MCHC 33.0 30.0 - 36.0 g/dL   RDW 85.3 88.4 - 84.4 %   Platelets 247 150 - 400 K/uL   nRBC 0.0 0.0 - 0.2 %    Comment: Performed at [facility], [address], [place], [place] [address]  Surgical PCR screen     Status: Abnormal   Collection Time: 09/29/24  1:50 AM   Specimen: Nasal Mucosa; Nasal Swab  Result Value Ref Range   MRSA, PCR NEGATIVE NEGATIVE   Staphylococcus aureus POSITIVE (A) NEGATIVE    Comment: (NOTE) The Xpert SA Assay (FDA approved for NASAL specimens in patients 22 years of age and older), is one component of a comprehensive surveillance program. It is not intended to diagnose infection nor to guide or monitor treatment. Performed at [facility], [address], [place], [place] [address]     CT Soft Tissue Neck W Contrast Result Date: 09/28/2024 CLINICAL DATA:  Initial evaluation for acute peritonsillar abscess. EXAM: CT NECK WITH CONTRAST TECHNIQUE: Multidetector CT imaging of the neck was performed using the standard protocol following the bolus administration of  intravenous contrast. RADIATION DOSE REDUCTION: This exam was performed according to  the departmental dose-optimization program which includes automated exposure control, adjustment of the mA and/or kV according to patient size and/or use of iterative reconstruction technique. CONTRAST:  75mL OMNIPAQUE  IOHEXOL  350 MG/ML SOLN COMPARISON:  None Available. FINDINGS: Pharynx and larynx: Oral cavity within normal limits. Palatine tonsils are enlarged and hypertrophied, consistent with acute tonsillitis. Changes are worse on the right. Superimposed hypodense collection measuring 12 x 7 x 16 mm at the lateral margin of the right tonsil (series 4, image 64). An additional smaller collection measuring 10 x 5 x 14 mm at the medial right tonsil (series 4, image 64). Findings consistent with small tonsillar/peritonsillar abscesses. Associated mucosal edema within the right greater than left pharynx, compatible with associated pharyngitis. Right aspect of the epiglottis is mildly thickened. Hazy inflammatory stranding extends to involve the right parapharyngeal and submandibular spaces. Associated small reactive layering retropharyngeal effusion without loculated retropharyngeal abscess. Remainder of the hypopharynx and supraglottic larynx otherwise grossly within normal limits, although evaluation limited by motion. Glottis grossly within normal limits. Subglottic airway clear. Salivary glands: Parotid glands within normal limits. Left submandibular gland normal. Hazy inflammatory changes partially surround the right submandibular gland related to the acute inflammatory process within the right neck. Thyroid: Normal. Lymph nodes: Enlarged right upper cervical lymph nodes, with the largest nodal conglomerate measuring up to 1.8 cm short axis, presumably reactive. Vascular: Normal intravascular enhancement seen throughout the neck. Atheromatous change about the carotid bifurcations and skull base. Limited intracranial: Unremarkable. Visualized orbits: Unremarkable. Mastoids and visualized paranasal sinuses:  Scattered mucosal thickening present throughout the ethmoidal air cells and maxillary sinuses. Superimposed air-fluid levels within both maxillary sinuses. Mastoid air cells and middle ear cavities are clear. Skeleton: No worrisome osseous lesions. Prior fusion at C3-4. Moderate spondylosis at C4-5 and C5-6. Upper chest: No other acute finding.  Visualized lungs are clear. Other: None. IMPRESSION: 1. Findings consistent with acute right-sided tonsillitis/pharyngitis, with superimposed 2 distinct right tonsillar/peritonsillar abscesses measuring up to 16 mm as above. 2. Enlarged right upper cervical lymph nodes, presumably reactive. 3. Scattered mucosal thickening throughout the ethmoidal air cells and maxillary sinuses, with superimposed air-fluid levels within both maxillary sinuses. Clinical correlation for concomitant acute sinusitis recommended. Electronically Signed   By: Morene Hoard M.D.   On: 09/28/2024 21:38    ROS Blood pressure (!) 136/93, pulse 67, temperature 98 F (36.7 C), temperature source Oral, resp. rate 20, height 5' 8 (1.727 m), weight 101.2 kg, SpO2 100%. Physical Exam Constitutional:      Appearance: Normal appearance.  HENT:     Head: Normocephalic and atraumatic.     Right Ear: Tympanic membrane is without lesions and middle ear aerated, ear canal and external ear normal.     Left Ear: Tympanic membrane is without lesions and middle ear aerated, ear canal and external ear normal.     Nose: Nose normal. Turbinates with mild hypertrophy, No significant swelling or masses.     Oral cavity/oropharynx: buldging and erythema of the right tonsil. No evidence of exophytic tissue or consistent with cancer. Mucous membranes are moist. No lesions or masses    Larynx: normal voice. Mirror attempted without success    Eyes:     Extraocular Movements: Extraocular movements intact.     Conjunctiva/sclera: Conjunctivae normal.     Pupils: Pupils are equal, round, and reactive to  light.  Cardiovascular:     Rate and Rhythm: Normal rate.  Pulmonary:  Effort: Pulmonary effort is normal.  Musculoskeletal:     Cervical back: Normal range of motion and neck supple. No rigidity.  Lymphadenopathy:     Cervical: No cervical adenopathy or masses.salivary glands without lesions. .  Neurological:     Mental Status: He is alert. CN 2-12 intact. No nystagmus      Assessment/Plan: Right PTA- Pt now needs I/D as he has failed medical therapy. We discussed I/D and risks, benefits and options,. All questions answered and consent obtained. The OP sprayed with hurricane and injected the peritonsillar area with 1% lido with epi 2 cc. An incision made in the right peritonsillar area and tonsil hemastat opened the space with pus expressed. Minimal bleeding and the patient tolerated it well. The patient can be discharged if able to take fluids and diet. The patient follow if there is not improvement to resolution in 3 days. Follow up if worse or any issues immediately. [phone]   Norleen Notice 09/29/2024, 9:03 AM

## 2024-09-29 NOTE — Plan of Care (Signed)

## 2024-09-29 NOTE — Progress Notes (Addendum)
 Patient able to tolerate drinking and eating. Stated he felt pressure, not really pain. Lunch was tolerated well. Has used suction , doctor notified. Patient will be discharged

## 2024-09-29 NOTE — Progress Notes (Signed)
 Patient discharged, left with family to go home. IV removed, belongings with patient-wallet, cell phone, charger. Education given on medication, follow up with doctor. No distress noted. Education given on peritonsillar abscess.

## 2024-09-29 NOTE — Care Management CC44 (Signed)
 Condition Code 44 Documentation Completed  Patient Details  Name: JAMAN ARO MRN: [id number] Date of Birth: 1956-08-15   Condition Code 44 given:  Yes Patient signature on Condition Code 44 notice:  Yes Documentation of 2 MD's agreement:  Yes Code 44 added to claim:  Yes    Marval Gell, RN 09/29/2024, 1:22 PM

## 2024-09-29 NOTE — Plan of Care (Signed)
  Problem: Education: Goal: Knowledge of General Education information will improve Description: Including pain rating scale, medication(s)/side effects and non-pharmacologic comfort measures 09/29/2024 1333 by Burnard Almarie BROCKS, RN Outcome: Adequate for Discharge 09/29/2024 0836 by Burnard Almarie BROCKS, RN Outcome: Progressing   Problem: Health Behavior/Discharge Planning: Goal: Ability to manage health-related needs will improve 09/29/2024 1333 by Burnard Almarie BROCKS, RN Outcome: Adequate for Discharge 09/29/2024 0836 by Burnard Almarie BROCKS, RN Outcome: Progressing   Problem: Clinical Measurements: Goal: Ability to maintain clinical measurements within normal limits will improve 09/29/2024 1333 by Burnard Almarie BROCKS, RN Outcome: Adequate for Discharge 09/29/2024 0836 by Burnard Almarie BROCKS, RN Outcome: Progressing Goal: Will remain free from infection 09/29/2024 1333 by Burnard Almarie BROCKS, RN Outcome: Adequate for Discharge 09/29/2024 0836 by Burnard Almarie BROCKS, RN Outcome: Progressing Goal: Diagnostic test results will improve 09/29/2024 1333 by Burnard Almarie BROCKS, RN Outcome: Adequate for Discharge 09/29/2024 0836 by Burnard Almarie BROCKS, RN Outcome: Progressing Goal: Respiratory complications will improve 09/29/2024 1333 by Burnard Almarie BROCKS, RN Outcome: Adequate for Discharge 09/29/2024 0836 by Burnard Almarie BROCKS, RN Outcome: Progressing Goal: Cardiovascular complication will be avoided 09/29/2024 1333 by Burnard Almarie BROCKS, RN Outcome: Adequate for Discharge 09/29/2024 0836 by Burnard Almarie BROCKS, RN Outcome: Progressing   Problem: Activity: Goal: Risk for activity intolerance will decrease 09/29/2024 1333 by Burnard Almarie BROCKS, RN Outcome: Adequate for Discharge 09/29/2024 0836 by Burnard Almarie BROCKS, RN Outcome: Progressing   Problem: Nutrition: Goal: Adequate nutrition will be maintained 09/29/2024 1333 by Burnard Almarie BROCKS, RN Outcome: Adequate for  Discharge 09/29/2024 0836 by Burnard Almarie BROCKS, RN Outcome: Progressing   Problem: Coping: Goal: Level of anxiety will decrease 09/29/2024 1333 by Burnard Almarie BROCKS, RN Outcome: Adequate for Discharge 09/29/2024 0836 by Burnard Almarie BROCKS, RN Outcome: Progressing   Problem: Elimination: Goal: Will not experience complications related to bowel motility 09/29/2024 1333 by Burnard Almarie BROCKS, RN Outcome: Adequate for Discharge 09/29/2024 0836 by Burnard Almarie BROCKS, RN Outcome: Progressing Goal: Will not experience complications related to urinary retention 09/29/2024 1333 by Burnard Almarie BROCKS, RN Outcome: Adequate for Discharge 09/29/2024 0836 by Burnard Almarie BROCKS, RN Outcome: Progressing   Problem: Pain Managment: Goal: General experience of comfort will improve and/or be controlled 09/29/2024 1333 by Burnard Almarie BROCKS, RN Outcome: Adequate for Discharge 09/29/2024 0836 by Burnard Almarie BROCKS, RN Outcome: Progressing   Problem: Safety: Goal: Ability to remain free from injury will improve 09/29/2024 1333 by Burnard Almarie BROCKS, RN Outcome: Adequate for Discharge 09/29/2024 0836 by Burnard Almarie BROCKS, RN Outcome: Progressing   Problem: Skin Integrity: Goal: Risk for impaired skin integrity will decrease 09/29/2024 1333 by Burnard Almarie BROCKS, RN Outcome: Adequate for Discharge 09/29/2024 0836 by Burnard Almarie BROCKS, RN Outcome: Progressing

## 2024-09-29 NOTE — Care Management Obs Status (Signed)
 MEDICARE OBSERVATION STATUS NOTIFICATION   Patient Details  Name: Todd Lynn MRN: [id number] Date of Birth: February 03, 1956   Medicare Observation Status Notification Given:  Yes    Marval Gell, RN 09/29/2024, 1:22 PM

## 2024-10-26 ENCOUNTER — Other Ambulatory Visit (HOSPITAL_COMMUNITY): Payer: Self-pay

## 2024-10-26 ENCOUNTER — Other Ambulatory Visit: Payer: Self-pay | Admitting: Cardiology

## 2024-10-26 DIAGNOSIS — I2510 Atherosclerotic heart disease of native coronary artery without angina pectoris: Secondary | ICD-10-CM

## 2024-10-26 MED ORDER — PRASUGREL HCL 10 MG PO TABS
10.0000 mg | ORAL_TABLET | Freq: Every day | ORAL | 2 refills | Status: AC
  Filled 2024-10-26: qty 90, 90d supply, fill #0

## 2024-11-08 ENCOUNTER — Encounter: Payer: Self-pay | Admitting: Cardiovascular Disease

## 2024-11-26 NOTE — Progress Notes (Unsigned)
 " Cardiology Office Note   Date:  11/27/2024  ID:  Todd Lynn, DOB Oct 14, 1956, MRN [id number] PCP: Dyane Anthony RAMAN, FNP  [facility] Providers Cardiologist:  Ozell Fell, MD   History of Present Illness Todd Lynn is a 69 y.o. male with medical history significant of Ischemic cardiomyopathy, coronary artery disease, diastolic dysfunction CHF, essential hypertension, osteoarthritis, who presented to the ER with 2 weeks of sore throat and difficulty swallowing. He is here for hospital follow-up appointment.   In the ER, he denied any cold symptoms prior to this. Denied any fever or chills denied any nausea vomiting or diarrhea. Patient did not take any antibiotics. He was seen in the ER with significant pain and nausea. Patient was found to have leukocytosis, hyponatremia, CT of the neck shows findings close with acute right-sided tonsillitis or pharyngitis with superimposed peritonsillar abscess. Also some cervical lymphadenopathy. Dr. Roark was consulted and plans surgery tomorrow. Patient to be admitted to medical service with IV antibiotics.   Cardiac history includes, PCI April 2025 due to anterior STEMI.  During his visit blood pressure was elevated and carvedilol  was increased to 6.25 mg and spironolactone  12.5 mg was started.  Denied chest pain and was cleared to proceed with cardiac rehab.  Referred to lipid clinic due to elevated LP(a) to discuss PCSK9 inhibitors with plan to add in the future.  He was seen by [facility] last summer for follow-up and had been doing well.  Adjusting his diet aiming to keep sodium intake around 2300 mg/day.  Consumes Greek yogurt with blackberries or blueberries and tries to incorporate fruits and vegetables in his diet.  Tries to go without meat twice a week and is cautious about cholesterol and sodium intake.  He had lost some weight down to 225 pounds from 242.  He denied any cardiovascular symptoms at that visit.  Today, he presents with  a hx of coronary artery disease for follow-up after stent placement (12/2023).   He has coronary artery disease with prior heart attack and underwent LAD stent placement in March of last year for a 95% lesion following an episode of shortness of breath on exertion. Since the procedure he reports no chest pain, shortness of breath, swelling, or decreased exercise tolerance and walks up to 6.5 miles without limitation.  He has elevated A1c levels, from 6.2 in March to 7.2 in December, consistent with diabetes. A recent home health test showed an A1c of 5.7, which he questions. He is trying to lose weight and remain physically active, preferring outdoor walking. His mother and brother had diabetes with related complications  Reports no shortness of breath nor dyspnea on exertion. Reports no chest pain, pressure, or tightness. No edema, orthopnea, PND. Reports no palpitations.   Discussed the use of AI scribe software for clinical note transcription with the patient, who gave verbal consent to proceed.  ROS: Pertinent ROS in HPI  Studies Reviewed     Diagnostic Dominance: Right Left Main  Vessel is angiographically normal.  Dist LM lesion is 20% stenosed.    Left Anterior Descending  Ost LAD lesion is 50% stenosed.  Prox LAD lesion is 30% stenosed.  Prox LAD to Mid LAD lesion is 95% stenosed. Vessel is the culprit lesion. The lesion is type C and located at the major branch.  Mid LAD lesion is 30% stenosed.  Dist LAD lesion is 80% stenosed.    Ramus Intermedius  Ramus lesion is 95% stenosed.    Left Circumflex  Vessel is angiographically normal.    Second Obtuse Marginal Branch  Vessel is angiographically normal.    Third Obtuse Marginal Branch  Vessel is angiographically normal.    Right Coronary Artery  Ost RCA lesion is 20% stenosed.  Mid RCA lesion is 30% stenosed.    Intervention   Prox LAD to Mid LAD lesion  Stent  Lesion length: 15 mm. CATH LAUNCHER 6FR EBU3.5 guide  catheter was inserted. Lesion crossed with guidewire using a WIRE RUNTHROUGH .[id number]. Pre-stent angioplasty was performed using a BALLN EMERGE MR 2.5X12. Maximum pressure: 10 atm. Inflation time: 20 sec. A drug-eluting stent was successfully placed using a SYNERGY XD 2.75X20. Maximum pressure: 12 atm. Inflation time: 20 sec. Post-stent angioplasty was performed using a BALL SAPPHIRE NC24 3.0X15. Maximum pressure: 14 atm. Inflation time: 20 sec.  Post-Intervention Lesion Assessment  The intervention was successful. Pre-interventional TIMI flow is 2. Post-intervention TIMI flow is 3. No complications occurred at this lesion.  There is a 0% residual stenosis post intervention.     Wall Motion              Left Heart  Left Ventricle The left ventricle is mildly dilated. There is moderate to severe left ventricular systolic dysfunction. LV end diastolic pressure is moderately elevated. The left ventricular ejection fraction is 25-35% by visual estimate. There are LV function abnormalities due to segmental dysfunction.   Coronary Diagrams  Diagnostic Dominance: Right  Intervention    Implants       Physical Exam VS:  BP 124/72   Pulse (!) 59   Ht 5' 8 (1.727 m)   Wt 228 lb (103.4 kg)   SpO2 99%   BMI 34.67 kg/m        Wt Readings from Last 3 Encounters:  11/27/24 228 lb (103.4 kg)  09/28/24 223 lb (101.2 kg)  08/22/24 225 lb (102.1 kg)    GEN: Well nourished, well developed in no acute distress NECK: No JVD; No carotid bruits CARDIAC: RRR, no murmurs, rubs, gallops RESPIRATORY:  Clear to auscultation without rales, wheezing or rhonchi  ABDOMEN: Soft, non-tender, non-distended EXTREMITIES:  No edema; No deformity   ASSESSMENT AND PLAN  Coronary artery disease, post-LAD stent Status post-LAD stent placement with no current angina or dyspnea. Regular physical activity without limitations. - Continue current exercise regimen as tolerated. - Monitor for new symptoms  such as chest pain or dyspnea. -continue current medications -lipid profile looked good from 05/2024, LDL 60, Tgs 93.   Chronic heart failure with mildly reduced ejection fraction No current symptoms of heart failure exacerbation. Regular physical activity and weight management maintained. - Continue current management and lifestyle modifications.  Type 2 diabetes mellitus Recent A1c of 7.2 indicates poor glycemic control. Discrepancy with home health nurse's A1c of 5.7. Possible influence of recent abscess on A1c levels. - Repeat A1c test to confirm current glycemic status. He is reaching out to PCP for this.  - Discuss results with primary care provider for further management.      Dispo: He can return in 6 months to see MD.   Signed, Orren LOISE Fabry, PA-C   "

## 2024-11-27 ENCOUNTER — Ambulatory Visit: Attending: Physician Assistant | Admitting: Physician Assistant

## 2024-11-27 ENCOUNTER — Other Ambulatory Visit (HOSPITAL_COMMUNITY): Payer: Self-pay

## 2024-11-27 VITALS — BP 124/72 | HR 59 | Ht 68.0 in | Wt 228.0 lb

## 2024-11-27 DIAGNOSIS — E785 Hyperlipidemia, unspecified: Secondary | ICD-10-CM | POA: Diagnosis not present

## 2024-11-27 DIAGNOSIS — I5022 Chronic systolic (congestive) heart failure: Secondary | ICD-10-CM | POA: Diagnosis not present

## 2024-11-27 DIAGNOSIS — I10 Essential (primary) hypertension: Secondary | ICD-10-CM | POA: Diagnosis not present

## 2024-11-27 DIAGNOSIS — I2109 ST elevation (STEMI) myocardial infarction involving other coronary artery of anterior wall: Secondary | ICD-10-CM | POA: Diagnosis not present

## 2024-11-27 DIAGNOSIS — I2510 Atherosclerotic heart disease of native coronary artery without angina pectoris: Secondary | ICD-10-CM

## 2024-11-27 DIAGNOSIS — E7849 Other hyperlipidemia: Secondary | ICD-10-CM

## 2024-11-27 MED ORDER — NITROGLYCERIN 0.4 MG SL SUBL
0.4000 mg | SUBLINGUAL_TABLET | SUBLINGUAL | 2 refills | Status: AC | PRN
  Filled 2024-11-27: qty 25, 9d supply, fill #0

## 2024-11-27 NOTE — Patient Instructions (Signed)
 Medication Instructions:   Your physician recommends that you continue on your current medications as directed. Please refer to the Current Medication list given to you today.   *If you need a refill on your cardiac medications before your next appointment, please call your pharmacy*   Lab Work: NONE ORDERED  TODAY    If you have labs (blood work) drawn today and your tests are completely normal, you will receive your results only by: MyChart Message (if you have MyChart) OR A paper copy in the mail If you have any lab test that is abnormal or we need to change your treatment, we will call you to review the results.    Testing/Procedures: NONE ORDERED  TODAY     Follow-Up:  At [facility], you and your health needs are our priority.  As part of our continuing mission to provide you with exceptional heart care, our providers are all part of one team.  This team includes your primary Cardiologist (physician) and Advanced Practice Providers or APPs (Physician Assistants and Nurse Practitioners) who all work together to provide you with the care you need, when you need it.   Your next appointment:   6 month(s)  Provider:   Ozell Fell, MD  /APP     We recommend signing up for the patient portal called MyChart.  Sign up information is provided on this After Visit Summary.  MyChart is used to connect with patients for Virtual Visits (Telemedicine).  Patients are able to view lab/test results, encounter notes, upcoming appointments, etc.  Non-urgent messages can be sent to your provider as well.   To learn more about what you can do with MyChart, go to [email or web address].   Other Instructions
